# Patient Record
Sex: Female | Born: 1945 | Race: White | Hispanic: No | Marital: Single
Health system: Western US, Academic
[De-identification: ages and names within clinical notes are randomized; demographics above are authoritative.]

---

## 2013-12-28 MED ADMIN — predniSONE (DELTASONE) tablet 5 mg: 5 mg | ORAL | @ 16:00:00 | NDC 00054872425

## 2013-12-28 MED ADMIN — LORazepam (ATIVAN) tablet 0.25 mg: 0.25 mg | ORAL | @ 08:00:00 | NDC 51079041701

## 2013-12-28 MED ADMIN — LORazepam (ATIVAN) tablet 0.5 mg: 0.5 mg | ORAL | NDC 51079041701

## 2013-12-28 MED ADMIN — 0.9 % sodium chloride flush injection syringe: 3 mL | INTRAVENOUS | @ 09:00:00

## 2013-12-29 MED ADMIN — predniSONE (DELTASONE) tablet 5 mg: 5 mg | ORAL | @ 15:00:00 | NDC 00054472831

## 2013-12-29 MED ADMIN — meTOPROLOL tartrate (LOPRESSOR) tablet 50 mg: 50 mg | ORAL | @ 04:00:00 | NDC 00904634161

## 2013-12-29 MED ADMIN — meTOPROLOL tartrate (LOPRESSOR) tablet 100 mg: 100 mg | ORAL | @ 15:00:00 | NDC 00093073401

## 2013-12-29 MED ADMIN — LORazepam (ATIVAN) tablet 0.5 mg: 0.5 mg | ORAL | @ 22:00:00 | NDC 51079041701

## 2013-12-30 MED ADMIN — predniSONE (DELTASONE) tablet 5 mg: 5 mg | ORAL | @ 17:00:00 | NDC 00054472831

## 2013-12-30 MED ADMIN — meTOPROLOL tartrate (LOPRESSOR) tablet 100 mg: 100 mg | ORAL | @ 17:00:00 | NDC 00093073401

## 2013-12-30 MED ADMIN — 0.9 % sodium chloride flush injection syringe: 3 mL | INTRAVENOUS | @ 11:00:00

## 2013-12-30 MED ADMIN — LORazepam (ATIVAN) tablet 0.5 mg: 0.5 mg | ORAL | @ 17:00:00 | NDC 51079041701

## 2013-12-30 MED ADMIN — phytonadione (MEPHYTON) tablet 5 mg: 5 mg | ORAL | @ 02:00:00 | NDC 00187170405

## 2013-12-30 MED ADMIN — lansoprazole (PREVACID) capsule 30 mg: 30 mg | ORAL | @ 17:00:00 | NDC 00781214801

## 2013-12-30 MED ADMIN — phytonadione (MEPHYTON) tablet 5 mg: 5 mg | ORAL | @ 17:00:00 | NDC 00187170405

## 2013-12-31 MED ADMIN — LORazepam (ATIVAN) tablet 0.5 mg: 0.5 mg | ORAL | @ 19:00:00 | NDC 51079041701

## 2013-12-31 MED ADMIN — meTOPROLOL tartrate (LOPRESSOR) tablet 100 mg: 100 mg | ORAL | @ 15:00:00 | NDC 00093073401

## 2013-12-31 MED ADMIN — traZODone (DESYREL) tablet 50 mg: 50 mg | ORAL | @ 08:00:00 | NDC 00904399061

## 2013-12-31 MED ADMIN — LORazepam (ATIVAN) tablet 0.5 mg: 0.5 mg | ORAL | @ 01:00:00 | NDC 51079041701

## 2013-12-31 MED ADMIN — predniSONE (DELTASONE) tablet 5 mg: 5 mg | ORAL | @ 15:00:00 | NDC 00054472831

## 2013-12-31 MED ADMIN — enoxaparin (LOVENOX) injection syringe 40 mg: 40 mg | SUBCUTANEOUS | @ 21:00:00 | NDC 00075062040

## 2013-12-31 MED ADMIN — phytonadione (MEPHYTON) tablet 5 mg: 5 mg | ORAL | @ 15:00:00 | NDC 00187170405

## 2014-01-01 MED ADMIN — magnesium sulfate in dextrose 5 % 1 g/100 mL IVPB 1 g: 1 g | INTRAVENOUS | @ 19:00:00 | NDC 00409672723

## 2014-01-01 MED ADMIN — 0.9 % sodium chloride flush injection syringe: 3 mL | INTRAVENOUS | @ 12:00:00

## 2014-01-01 MED ADMIN — potassium chloride (KDUR; KLOR-CON) SR tablet 40 mEq: 40 meq | ORAL | @ 17:00:00 | NDC 68084052411

## 2014-01-01 MED ADMIN — LORazepam (ATIVAN) tablet 0.5 mg: 0.5 mg | ORAL | NDC 51079041701

## 2014-01-01 MED ADMIN — magnesium sulfate in dextrose 5 % 1 g/100 mL IVPB 1 g: 1 g | INTRAVENOUS | @ 17:00:00 | NDC 00409672723

## 2014-01-01 MED ADMIN — predniSONE (DELTASONE) tablet 5 mg: 5 mg | ORAL | @ 15:00:00 | NDC 00054472831

## 2014-01-01 MED ADMIN — LORazepam (ATIVAN) tablet 0.5 mg: 0.5 mg | ORAL | @ 19:00:00 | NDC 51079041701

## 2014-01-01 MED ADMIN — meTOPROLOL tartrate (LOPRESSOR) tablet 100 mg: 100 mg | ORAL | @ 15:00:00 | NDC 00093073401

## 2014-01-01 MED ADMIN — magnesium oxide (MAG-OX) tablet 500 mg: 500 mg | ORAL | @ 07:00:00

## 2014-01-02 MED ADMIN — 0.9 % sodium chloride flush injection syringe: 3 mL | INTRAVENOUS | @ 05:00:00

## 2014-01-02 MED ADMIN — 0.9 % sodium chloride flush injection syringe: 3 mL | INTRAVENOUS | @ 01:00:00

## 2014-01-02 MED ADMIN — predniSONE (DELTASONE) tablet 5 mg: 5 mg | ORAL | @ 17:00:00 | NDC 00054472831

## 2014-01-02 MED ADMIN — meTOPROLOL tartrate (LOPRESSOR) tablet 100 mg: 100 mg | ORAL | @ 17:00:00 | NDC 00093073401

## 2014-01-02 MED ADMIN — LORazepam (ATIVAN) tablet 0.5 mg: 0.5 mg | ORAL | @ 20:00:00 | NDC 51079041701

## 2014-01-02 MED ADMIN — 0.9 % sodium chloride flush injection syringe: 3 mL | INTRAVENOUS | @ 17:00:00

## 2014-01-02 MED ADMIN — enoxaparin (LOVENOX) injection syringe 40 mg: 40 mg | SUBCUTANEOUS | @ 17:00:00 | NDC 63323056887

## 2016-08-11 NOTE — Patient Instructions (Signed)
1. Hand therapy/ physical therapy   2. Discussed treatment options for Dupuytren's ,                3. F/u in 6 weeks               4. Wrist splints at night

## 2016-08-11 NOTE — Progress Notes (Signed)
This is an independent visit    Orthopaedic Hand and Upper Extremity Surgery New Patient Note     I had the pleasure of evaluating Aimee Smith in the [facility] today.     Chief Complaint:  Cramping and pain in both hands    History of Present Illness: Aimee Smith is a 71 y.o. year old RHD female who presents today for the first time to my clinic. Most recently has also noticed sharp spasm on right side of neck.    Duration of symptoms: one year    She rates the pain/cramping as a 10/10. The pain is located at the bilateral all fingers on the . The pain with cramping is described as sharp. Occurs daily.     She does not describe numbness or tingling, and does not describe weakness. She does not describe clicking, catching. She does experience intermittent triggering in Left MF.    The pain or numbness/tingling does not radiate. The patient does have symptoms at night. She does not have symptoms with resisted activity. Rest make the pain better. Motion does make the pain worse.  Sometimes when she moves her arms a certain way it will initiate the spasms.   She has tried gabapentin neuropathy in the feet. Also Keppra for seizures.    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 She also has no past medical history of Allergic state; Anemia; Anxiety; Asthma; Bleeding disorder; Blood transfusion without reported diagnosis; Cancer; CHF (congestive heart failure); Chronic kidney disease; COPD (chronic obstructive pulmonary disease); Coronary artery disease; Diabetes mellitus; Glaucoma; Heart disease; Human  immunodeficiency virus (HIV) disease; Intestinal disease; Melanoma; Myocardial infarction; Osteoporosis; Sinus disorder; Skin disease; Stroke; or Ulcer.  The remainder of the patient's history was reviewed and is noncontributory to this illness or injury.    PSH:  Aimee Smith  has a past surgical history that includes Hysterectomy and Appendectomy.    Medications:  Current Outpatient Prescriptions   Medication Sig Dispense Refill    gabapentin (NEURONTIN) 300 mg capsule TAKE 1 CAPSULE BY MOUTH 3 TIMES DAILY AND 2 CAPS AT BEDTIME (Patient taking differently: TAKES 2 CAPS AT BEDTIME) 180 capsule 11    levETIRAcetam (KEPPRA) 250 mg tablet Take 0.5 tablets (125 mg total) by mouth Twice a day. 30 tablet 11    melatonin 10 mg TAB Take 10 mg by mouth nightly at bedtime.      metoprolol tartrate (LOPRESSOR) 50 mg tablet 1 tablet in the morning, 1/2 tablet at night 135 tablet 3    mometasone (ELOCON) 0.1 % lotion Apply to scalp at night 60 mL 1    permethrin (ELIMITE) 5 % cream Apply from neck down today and then in 1 week 120 g 1    triamcinolone (KENALOG) 0.1 % ointment Apply twice daily as needed to trunk and legs 454 g 0     No current facility-administered medications for this visit.        Allergies:  Aimee Smith is allergic to morphine.    FH:  Her family history includes Basal cell carcinoma in her sister; Cancer in her father; Hypertension in her mother; Mitral valve prolapse in her mother; Osteoporosis in her mother; Skin ca.  unk/oth in her father.  The remainder of the patient's history was reviewed and is noncontributory to this illness or injury.    SH:  Aimee Smith  reports that she quit smoking about 33 years ago. Her smoking use included Cigarettes. She has a 5.00 pack-year smoking history. She has never used smokeless tobacco. She reports that she drinks alcohol. She reports that she does not use drugs.  The remainder of the patient's history was reviewed and is noncontributory to this illness or  injury.    Occupation/Recreational Activities:retired.  Enjoys golf  Review of Systems:   Constitutional - Negative  Eyes - Negative  Cardiovascular - Negative  Respiratory - Negative  GI - Negative  GU - Negative  Musculoskeletal - Upper extremity pain  Skin - Negative  Neurological - Negative  Psychiatric - Negative   Endocrine - Negative  Hematological / Lymphatic - Negative    Physical Exam:  BP Readings from Last 3 Encounters:   05/25/16 113/46   04/22/16 120/52   03/25/16 112/49     Wt Readings from Last 3 Encounters:   08/11/16 69.2 kg (152 lb 8 oz)   05/25/16 69.2 kg (152 lb 8 oz)   04/22/16 71.2 kg (156 lb 14.4 oz)     Estimated body mass index is 27.01 kg/m as calculated from the following:    Height as of this encounter: 160 cm (5\' 3" ).    Weight as of this encounter: 69.2 kg (152 lb 8 oz).  Estimated body surface area is 1.75 meters squared as calculated from the following:    Height as of this encounter: 160 cm (5\' 3" ).    Weight as of this encounter: 69.2 kg (152 lb 8 oz).    Appearance: Well developed, well nourished.  HEENT: Normocephalic, external ear normal bilaterally, nose normal, mucus membranes moist, PERRL, no scleral icterus, EOM normal, conjunctiva normal.  Neck: ROM normal and supple.  Cardiac: Intact distal pulses.  Pulmonary: Effort normal, no wheezing.  GI: No guarding or distension.  Neurological: A&O x3, normal coordination and tone.  Psych: Normal mood, affect and behavior. Normal thought content and judgment.  MSK: Gait normal    Right and left Upper Extremity:  Skin: intact  Swelling/Efusion: non  Deformity: dupuytren's in both hands cord in web on rt and index, palmer cord IF,, RRF & RMF  palmercords   ROM: full rom  Tenderness:   Tender LMF pulley  Circulation: <2s capillary refill in all digits  Sensation: Present to light touch in all digits,  Strength: 5 / 5   Phalens: negative, tinels: negative at wrist and elbows  Special Tests:      Imaging:  I personally reviewed and  interpreted the radiographs obtained.  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     Assessment:  1. Subjectively presents as possible cts and cubital tunnel, but objectively negative testing  2. Poly articular cramping  3. Dupuytren's contracture  4. Neck spasms    Plan:  The patient was educated extensively about their condition, including the pathology, etiology, natural history and treatment options. Non-operative measures as well as surgical interventions were both addressed. She voiced understanding, and all questions were answered.    I recommend initial treatment to include the following:    1. Hand therapy & physical therapy   2. Discussed treatment options for Dupuytren's ,  3. F/u in 6 weeks               4. Wrist splints at night    All of the patient's questions were answered and She agrees with this plan.

## 2016-08-26 NOTE — Patient Instructions (Addendum)
1) Please go by the office of your old gastroenterologist to get your colonoscopy records for me. I need to review them.  2) Do your home FIT kit to screen for colon cancer  3) Get labs done in April.  4) See me in May and we will follow up on the lab tests.  5) Schedule a visit at surgery clinic to discuss potential abdmonial surgery for pannus

## 2016-08-26 NOTE — Progress Notes (Signed)
Subjective    Aimee Smith is a 71 y.o. female who presents with concerns including No chief complaint on file.      History of present illness    Weight loss, intentional.  Thinks this is due to eating smaller portions/meals.   Overall good appetatie and energy    Hand pain and trigger finger  Rec is for hand pt, which plans to do.    Cirrhosis  Needs fu labs in April and ultrasound in October. Continues to abstain from etoh.    Tinges of pain at Upper L abdomen  Not happening currently    Varicose veins  Has more questions about these.    HCM  Forgot to get me her colo records from outside provider.      I reviewed the patient's pertinent allergies, medications, past medical history, social history and family history and made updates since the last visit date 05/25/2016 as appropriate.    ROS  All other systems were reviewed and are negative.    Objective      Vitals:    08/26/16 0953 08/26/16 0956   BP: 120/44 116/46   BP Location: Left upper arm Left upper arm   Patient Position: Sitting Sitting   Cuff Size: Adult Adult   Pulse: 53 55   SpO2: 100%    Weight: 68.7 kg (151 lb 8 oz)      Physical Exam   Constitutional: She is oriented to person, place, and time. She appears well-developed and well-nourished. No distress.   HENT:   Head: Normocephalic and atraumatic.   Eyes: Conjunctivae are normal. Pupils are equal, round, and reactive to light. No scleral icterus.   Pulmonary/Chest: Effort normal.   Musculoskeletal: She exhibits no edema.   Does not have compression stocking in place.   Neurological: She is alert and oriented to person, place, and time.   Skin: She is not diaphoretic.   Psychiatric: She has a normal mood and affect. Her behavior is normal. Thought content normal.           I personally reviewed and interpreted the following test(s):      Assessment and Plan        Problem based assessment and plan:      1. Health care maintenance  Given that we still don't have outside records, pt and I  discussed screening options. She is willing to do FIT test.  - Fecal Occult Blood Test (FIT); Future    3. Abdominal pannus  Now that has lost weight, has abdominal skin folds that may need pannuloplasty.  - Ambulatory referral to General Surgery; Future    4. Alcoholic cirrhosis of liver with ascites  Stable.  -due for labs and imaging later this year    5. Trigger finger, unspecified finger, unspecified laterality  Agree with hand PT plan  -sdvised pt to schedule    6. Varicose veins of both lower extremities  Vascular is not planning to offer rx and pt is not interestedin using compression stockings  -answered questions    Weight loss, intentional  Doing well with portion size control  -commended pt on slow steady weight loss                Medication List          Accurate as of 08/26/16 10:30 AM. If you have any questions, ask your nurse or doctor.  CHANGE how you take these medications    gabapentin 300 mg capsule  Commonly known as:  NEURONTIN  TAKE 1 CAPSULE BY MOUTH 3 TIMES DAILY AND 2 CAPS AT BEDTIME  What changed:  See the new instructions.        CONTINUE taking these medications    levETIRAcetam 250 mg tablet  Commonly known as:  KEPPRA  Take 0.5 tablets (125 mg total) by mouth Twice a day.     melatonin 10 mg Tab     metoprolol tartrate 50 mg tablet  Commonly known as:  LOPRESSOR  1 tablet in the morning, 1/2 tablet at night     mometasone 0.1 % lotion  Commonly known as:  ELOCON  Apply to scalp at night     permethrin 5 % cream  Commonly known as:  ELIMITE  Apply from neck down today and then in 1 week     triamcinolone 0.1 % ointment  Commonly known as:  KENALOG  Apply twice daily as needed to trunk and legs

## 2016-09-09 NOTE — Progress Notes (Signed)
Patient no-showed today's appointment.

## 2017-03-10 NOTE — Telephone Encounter (Signed)
Refill request from pharmacy   Medication name: gabapentin  Last prescribed: 08/28/16   Last appointment: 08/26/2016  Next appointment: Visit date not found    [facility]    Last  BP:  BP Readings from Last 1 Encounters:   08/26/16 116/46         Last Labs:      Lab Results   Component Value Date    Creatinine 0.48 05/27/2016    Sodium, Serum / Plasma 138 05/27/2016    Potassium, Serum / Plasma 4.0 05/27/2016    Hemoglobin A1c 5.6 07/11/2015    Cholesterol, Total 196 07/11/2015    LDL Cholesterol 132 (H) 07/11/2015    Cholesterol, HDL 48 07/11/2015    Thyroid Stimulating Hormone 2.77 02/19/2016    Hemoglobin 13.9 02/19/2016    eGFR if non-African American 99 05/27/2016    eGFR if African Amer 114 05/27/2016       If patient has not been seen in clinic for 12 months or more I have routed encounter to the administrative team to book a follow-up appointment.

## 2017-05-31 NOTE — Telephone Encounter (Signed)
Copied from [place] [id number]. Topic: [facility] - Immunization Request  >> May 31, 2017  4:19 PM Leana Gamer wrote:  IMMUNIZATION TEMPLATE    PATIENT NAME: [name]  DATE OF BIRTH: 1946-03-17  MRN: [id number]    HOME PHONE NUMBER:    [phone]    ALTERNATE PHONE NUMBER:       LEAVING A MESSAGE OK?:    Yes    IMMUNIZATION REQUESTED:  Shingles   REASON FOR IMMUNIZATION:    HAVE YOU HAD THIS IMMUNIZATION BEFORE: Yes  IF PPD, WAS PPD SKIN TEST POSITIVE:  IF POSITIVE, WAS THERE A CHEST X RAY PERFORMED:     MESSAGE / PROBLEM:  Pt called stating she had shingles vaccine last year. Pt stated that she heard there is a new one and would like to know if she should get that done as well. Pt is aware of the turn around time.    MESSAGE GENERATED BY [facility]  CRM NUMBER [phone] CREATED BY:    Leana Gamer,     05/31/2017,      4:19 PM

## 2017-06-01 MED ORDER — VARICELLA-ZOSTER GLYCOE VACC-AS01B ADJ(PF) 50 MCG/0.5 ML IM SUSP, KIT
50 | Freq: Once | INTRAMUSCULAR | 1 refills | Status: AC
Start: 2017-06-01 — End: 2017-06-01

## 2017-06-01 NOTE — Telephone Encounter (Signed)
Spoke to pt.  Pt is aware of message below.  No further questions

## 2017-06-01 NOTE — Telephone Encounter (Signed)
Clearnce Hasten, NP  You; [facility] 2 15 minutes ago (10:56 AM)      Order signed, and sent to pharmacy, thanks   Sonia Baller  (Routing comment)

## 2017-07-28 MED ORDER — METOPROLOL TARTRATE 50 MG TABLET: 50 mg | ORAL | 0 refills | Status: DC

## 2017-07-28 NOTE — Telephone Encounter (Signed)
Medication Metoprolol 50 mg  Last prescribed 07/29/16  Last appointment: 08/26/16  Next appointment: Visit date not found      Pharmacy [facility]ing Refill[facility]   [facility] [address]    Last  BP:  BP Readings from Last 1 Encounters:   08/26/16 116/46         Last Labs:      Lab Results   Component Value Date    Creatinine 0.48 05/27/2016    Sodium, Serum / Plasma 138 05/27/2016    Potassium, Serum / Plasma 4.0 05/27/2016    Hemoglobin A1c 5.6 07/11/2015    Cholesterol, Total 196 07/11/2015    LDL Cholesterol 132 (H) 07/11/2015    Cholesterol, HDL 48 07/11/2015    Thyroid Stimulating Hormone 2.77 02/19/2016    Hemoglobin 13.9 02/19/2016    eGFR if non-African American 99 05/27/2016    eGFR if African Amer 114 05/27/2016       If patient has not been seen in clinic for 12 months or more I have routed encounter to the administrative team to book a follow-up appointment.

## 2017-08-15 MED ORDER — LEVETIRACETAM 250 MG TABLET
250 | ORAL | 11 refills | Status: DC
Start: 2017-08-15 — End: 2018-06-01

## 2017-10-26 MED ORDER — METOPROLOL TARTRATE 50 MG TABLET
50 | ORAL | 0 refills | Status: DC
Start: 2017-10-26 — End: 2018-01-26

## 2018-01-26 MED ORDER — METOPROLOL TARTRATE 50 MG TABLET
50 | ORAL_TABLET | ORAL | 0 refills | Status: DC
Start: 2018-01-26 — End: 2018-04-26

## 2018-01-26 NOTE — Telephone Encounter (Signed)
Medication Metoprolol 81m  Last prescribed 10/26/2017  Last appointment: 08/26/2016  Next appointment: Visit date not found      [facility] [address]      Last  BP:  BP Readings from Last 1 Encounters:   08/26/16 116/46         Last Labs:      Lab Results   Component Value Date    Creatinine 0.48 05/27/2016    Sodium, Serum / Plasma 138 05/27/2016    Potassium, Serum / Plasma 4.0 05/27/2016    Hemoglobin A1c 5.6 07/11/2015    Cholesterol, Total 196 07/11/2015    LDL Cholesterol 132 (H) 07/11/2015    Cholesterol, HDL 48 07/11/2015    Thyroid Stimulating Hormone 2.77 02/19/2016    Hemoglobin 13.9 02/19/2016    eGFR if non-African American 99 05/27/2016    eGFR if African Amer 114 05/27/2016       If patient has not been seen in clinic for 12 months or more I have routed encounter to the administrative team to book a follow-up appointment.

## 2018-01-26 NOTE — Telephone Encounter (Signed)
Will refill 90 day supply pending appointment.

## 2018-01-26 NOTE — Telephone Encounter (Signed)
I called & spoke to pt this afternoon, she is aware of Dr. Larry Sierras is still on medical leave, she will call us back to schedule appt with Dr. Estrella Deeds.

## 2018-03-13 ENCOUNTER — Ambulatory Visit: Admit: 2018-03-13 | Discharge: 2018-03-13 | Payer: MEDICARE

## 2018-03-13 DIAGNOSIS — G40909 Epilepsy, unspecified, not intractable, without status epilepticus: Secondary | ICD-10-CM

## 2018-03-13 DIAGNOSIS — J849 Interstitial pulmonary disease, unspecified: Secondary | ICD-10-CM

## 2018-03-13 DIAGNOSIS — Z0000 Encounter for general adult medical examination without abnormal findings: Secondary | ICD-10-CM

## 2018-03-13 DIAGNOSIS — Z1231 Encounter for screening mammogram for malignant neoplasm of breast: Secondary | ICD-10-CM

## 2018-03-13 DIAGNOSIS — R600 Localized edema: Secondary | ICD-10-CM

## 2018-03-13 DIAGNOSIS — K7030 Alcoholic cirrhosis of liver without ascites: Secondary | ICD-10-CM

## 2018-03-13 DIAGNOSIS — K7031 Alcoholic cirrhosis of liver with ascites: Secondary | ICD-10-CM

## 2018-03-13 DIAGNOSIS — G4700 Insomnia, unspecified: Secondary | ICD-10-CM

## 2018-03-13 DIAGNOSIS — I8390 Asymptomatic varicose veins of unspecified lower extremity: Secondary | ICD-10-CM

## 2018-03-13 DIAGNOSIS — F1021 Alcohol dependence, in remission: Secondary | ICD-10-CM

## 2018-03-13 DIAGNOSIS — I471 Supraventricular tachycardia: Secondary | ICD-10-CM

## 2018-03-13 DIAGNOSIS — D696 Thrombocytopenia, unspecified: Secondary | ICD-10-CM

## 2018-03-13 DIAGNOSIS — L989 Disorder of the skin and subcutaneous tissue, unspecified: Secondary | ICD-10-CM

## 2018-03-13 DIAGNOSIS — E65 Localized adiposity: Secondary | ICD-10-CM

## 2018-03-13 LAB — COMPLETE BLOOD COUNT
Hematocrit: 41.2 % (ref 36–46)
Hemoglobin: 13.7 g/dL (ref 12.0–15.5)
MCH: 30.4 pg (ref 26–34)
MCHC: 33.3 g/dL (ref 31–36)
MCV: 92 fL (ref 80–100)
Platelet Count: 93 10*9/L — ABNORMAL LOW (ref 140–450)
RBC Count: 4.5 10*12/L (ref 4.0–5.2)
WBC Count: 3.4 10*9/L (ref 3.4–10)

## 2018-03-13 LAB — COMPREHENSIVE METABOLIC PANEL
AST: 36 U/L (ref 17–42)
Alanine transaminase: 21 U/L (ref 11–50)
Albumin, Serum / Plasma: 4 g/dL (ref 3.5–4.8)
Alkaline Phosphatase: 58 U/L (ref 31–95)
Anion Gap: 7 (ref 4–14)
Bilirubin, Total: 0.6 mg/dL (ref 0.2–1.3)
Calcium, total, Serum / Plasma: 9.4 mg/dL (ref 8.8–10.3)
Carbon Dioxide, Total: 27 mmol/L (ref 22–32)
Chloride, Serum / Plasma: 105 mmol/L (ref 97–108)
Creatinine: 0.67 mg/dL (ref 0.44–1.00)
Glucose, non-fasting: 103 mg/dL (ref 70–199)
Potassium, Serum / Plasma: 4.3 mmol/L (ref 3.5–5.1)
Protein, Total, Serum / Plasma: 7.5 g/dL (ref 6.0–8.4)
Sodium, Serum / Plasma: 139 mmol/L (ref 135–145)
Urea Nitrogen, Serum / Plasma: 9 mg/dL (ref 6–22)
eGFR - high estimate: 102 mL/min
eGFR - low estimate: 88 mL/min

## 2018-03-13 LAB — ALPHA-FETOPROTEIN, SERUM: Alpha-Fetoprotein, serum: 4.8 ug/L (ref <8.9)

## 2018-03-13 NOTE — Assessment & Plan Note (Signed)
Remains abstinent  - Continue night-time gabapentin, no medication changes today.

## 2018-03-13 NOTE — Patient Instructions (Addendum)
It was a pleasure seeing you in clinic today. Here are some of the things we discussed for you to do afterwards.  - FIT test (a test for blood in your stool)   - Please see the neurologist - ask if you can discontinue the [place]  - Please get your mammogram   - Please get your labs done.  - Please get your abdominal ultrasound done to check on your liver  - Please follow up with General Surgery for a possible pannuloplasty.

## 2018-03-13 NOTE — Progress Notes (Signed)
Chief Complaint   Patient presents with    Foot Pain       Subjective / HPI       Aimee Smith is a 72 y.o. 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      Last seen by PCP: 08/26/2016  Recent events: last seen in 2018, at which time she was continuing to lose weight, considered referral to GenSurg for possible pannuloplasty. Not seen since then, no labs.     Today: Reports that her health is generally improved, but she is worried about her weight  - is 158 today, was max 170 on prednisone, normal is 130. She is also worried about pain in her left foot that she thinks was caused by doubling it under while driving. Also wants follow up with neuro and pulm for her epilepsy and ILD respectively. Finally, she has some concerns about her balance and noted that she felt 'off balance' during a golf tournament 1 month ago     #Epilepsy.  Seen by Epilepsy in 2017, at which time they mentioned trying to discontinue her keppra at that time. They had had her off it for 6 weeks but she reported a feeling of something coming on, sothey put her on a lower dose. She has not had any seizure episodes since then. Unclear when last seizure was.    #Cirrhosis  No yellowing of her skin, occasional itching in feet that she attributes to her leg veins. No ascites. She notes that she has not had abdominal distension.     #ILD  No cough, no shortness of breath. Sometimes coughs after eatin but thinks that is probably unrelated. Notes that a lot of her weight gain is from the prednisone that she was on.     #Moderate alcohol use disorder, in sustained remission  Continues to take gabapentin at night only (it makes her sleepy). Has remained abstinent.     #Pannus  Requesting surgery consult for possible  pannuloplasty    #Varicose veins  Has varicose veins that bother her, wondering about any vascular surgery procedures she may need.    #Foot pain   Pain on left side of foot of unclear etiology. Askign if she needs referral to foot specialist. Pain is on the dorsum of her foot, not the bottom. No associated weakness or numbness.     #Balance issues  Sometimes feels 'unbalanced' especially when walking for long periods (eg golf tournament). Able to walk normally today.    Meds:     Current Outpatient Medications on File Prior to Visit   Medication Sig Dispense Refill    gabapentin (NEURONTIN) 300 mg capsule TAKE 1 CAPSULE BY MOUTH 3 TIMES DAILY AND 2 CAPS AT BEDTIME 180 capsule 11    levETIRAcetam (KEPPRA) 250 mg tablet TAKE 0.5 TABLET (HALF) BY MOUTH TWICE A DAY. 30 tablet 11    melatonin 10 mg TAB Take 10 mg by mouth nightly at bedtime.      metoprolol tartrate (LOPRESSOR) 50 mg tablet 1 TABLET IN THE MORNING, 1/2 TABLET AT NIGHT 135 tablet 0    mometasone (ELOCON) 0.1 % lotion Apply to scalp at night 60 mL 1    permethrin (ELIMITE) 5 % cream Apply from neck down today and then in 1 week 120 g 1  triamcinolone (KENALOG) 0.1 % ointment Apply twice daily as needed to trunk and legs 454 g 0     No current facility-administered medications on file prior to visit.            ROS:   Review of Systems   Constitutional: Positive for malaise/fatigue. Negative for chills, fever and weight loss.   Respiratory: Negative for cough, shortness of breath and wheezing.    Cardiovascular: Negative for chest pain and leg swelling.   Gastrointestinal: Negative for abdominal pain.        No distension  Pannus which she finds uncomfortable    Musculoskeletal: Positive for joint pain.   Skin: Positive for itching. Negative for rash.        Itching in b/l extremities that she attributse to varicose veins    Neurological: Negative for dizziness and seizures.             Exam / Objective Data    BP 139/57 (BP Location: Left upper  arm, Patient Position: Sitting, Cuff Size: Adult)   Pulse 54   Wt 72 kg (158 lb 11.2 oz)   BMI 28.11 kg/m       Physical Exam   Constitutional: She is oriented to person, place, and time. She appears well-developed and well-nourished. No distress.   HENT:   Head: Normocephalic and atraumatic.   Eyes: Pupils are equal, round, and reactive to light. Conjunctivae and EOM are normal.   Cardiovascular: Normal rate, regular rhythm and normal heart sounds. Exam reveals no gallop and no friction rub.   No murmur heard.  Pulmonary/Chest: Effort normal and breath sounds normal. No respiratory distress. She has no wheezes. She has no rales.   Breathing comfortably at rest and with ambulation    Abdominal: Soft. Bowel sounds are normal. She exhibits no distension. There is no tenderness. There is no rebound.   Liver edge not palpable  No ascites  Pannus    Musculoskeletal: Normal range of motion. She exhibits no edema.   Mild ttp in dorsum of left foot    Neurological: She is alert and oriented to person, place, and time.   Skin: Skin is warm and dry. She is not diaphoretic.   Marked varicose veins b/l in her LE    Psychiatric: She has a normal mood and affect. Her behavior is normal.         Assessment and Plan:     Aimee Smith is a 72 y.o. 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    #Epilepsy.  Unclear history of seizures, previously seen by Neuro who decreased her Keppra. Wonder if she might be able to come off it (especially if seizures were related to acute pancreatitis, as the chart appears to suggest). Previous neuro notes (eg 2016) mention that they think she may not need long-term anticonvulsants.   - Referred to Neurology to discuss discontinuing Keppra     #Cirrhosis  Secondary to alcohol use,  now in remission  EtOH: abstinent 12/2013, remains abstinent   [place] screens: neg [place] 03/2014, 09/2014; neg AFP 04/2014 and 07/2014; due, ordered today   Abdominal ultrasound: due, ordered today   EGD: done OSH 05/2013 no varices, r  Hep A: 03/20/2014, 09/25/2014  Hep B: 03/20/2014, 04/23/2014, 09/25/2014  - comprehensive metabolic panel  and CBC ordered today     #ILD  ILD was a diagnosis without clear cause. She was seen and diagnosed in 2013 with SOB and cough. A clear cause was not known and it was thought that this might be related to aspiration; and that she might possibly have achalasia. She udnerwent dilation (per Pulm note) which was useful.Currently she is asymptomatic with no shortness of breath or cough and normal pulmonary exam.  - CTM, no indication for Pulm referral today   - consider outpatient referral to S&S if she continues having difficulty with coughing    #Moderate alcohol use disorder, in sustained remission  Remains abstinent  - Continue night-time gabapentin, no medication changes today.     #Pannus  Requesting surgery consult for possible pannuloplasty.  - Referred to Gen Surg today.     #Varicose veins  Has varicose veins that bother her, wondering about any vascular surgery procedures she may need.  - Deferred Vascular referral today, will re-address as higher priority items addressed.    #Foot pain   Left foot pain, dorsal, unclear etiology.  -A1c with labs  - Further workup deferred to next visit given time constraints.     #Balance issues  Not experiencing symptoms today. Will address at next visit given time constraints.     #Health Maintenance  Cancer Screening  -Colonoscopy every 10 years or FIT yearly: due, ordered FIT test today   -Pap every 3 years (or every 5 years if co-tested with HPV and negative once >30): due, address at next visit   -low-dose CT if age between 75-74 and 71 pack year history: had in 2015  -Breast: mammography every 2 years starting age 78 up to age 66: last in 2017,  due, ordered     General  -DEXA age >57 women: uptodate from 32,normal     Cardiovascular  -AAA: [place] if age 32-75 female and current or former smoker: NA     Vaccines  -annual flu vaccine: indicated, not yet avaialble   -Tdap 1x with Td booster every 10 years: had 2011, due 2021  -Zoster for patients > 50 : needs shingrix, address at next vist   -Hep B vaccine if < 60, or >60 if diabetic   -Pneumococcus:  Up to date, had 2016    -Age>65, then PCV13+PPSV23   -Age< 64 with DM, current smoker, or chronic heart, lung, or liver disease, then PPSV23 only    -AGE < 65 with immunocompromise, PCV13+PPSV23        RTC 2 months - f/u labs, discuss Vascular, discuss imaging     [name], M.D, Ph.D  PGY2, Internal Medicine

## 2018-03-13 NOTE — Assessment & Plan Note (Signed)
ILD was a diagnosis without clear cause. She was seen and diagnosed in 2013 with SOB and cough. A clear cause was not known and it was thought that this might be related to aspiration; and that she might possibly have achalasia. She udnerwent dilation (per Pulm note) which was useful.Currently she is asymptomatic with no shortness of breath or cough and normal pulmonary exam.  - CTM, no indication for Pulm referral today   - consider outpatient referral to S&S if she continues having difficulty with coughing

## 2018-03-13 NOTE — Progress Notes (Signed)
03/15/18    ATTESTATION:    My date of service is 03/13/2018. I was present for and performed key portions of an examination of the patient. I am personally involved in the management of the patient. I agree with the findings and care plans as documented. 71W cirrhosis and thrombocytopenia out of care x1 year presenting for regular follow up. Will obtain baseline screening labs and ultrasound at today's visit; patient to return to clinic in a few weeks to follow up multiple issues.

## 2018-03-13 NOTE — Assessment & Plan Note (Signed)
Secondary to alcohol use, now in remission  EtOH: abstinent 12/2013, remains abstinent   [place] screens: neg [place] 03/2014, 09/2014; neg AFP 04/2014 and 07/2014; due, ordered today   Abdominal ultrasound: due, ordered today   EGD: done OSH 05/2013 no varices, r  Hep A: 03/20/2014, 09/25/2014  Hep B: 03/20/2014, 04/23/2014, 09/25/2014  - comprehensive metabolic panel and CBC ordered today

## 2018-03-14 LAB — HEMOGLOBIN A1C: Hemoglobin A1c: 5.7 PERCENT — ABNORMAL HIGH (ref 4.3–5.6)

## 2018-03-16 NOTE — Telephone Encounter (Signed)
Copied from [place] [id number]. Topic: EPIL - Advice  >> Mar 15, 2018  3:53 PM Theola Sequin wrote:  ADVICE TEMPLATE    PATIENT NAME: Aimee Smith  DATE OF BIRTH: October 13, 1945  MRN: [id number]    HOME PHONE NUMBER:     [phone]    ALTERNATE PHONE NUMBER:    ----  LEAVING A MESSAGE OK?:   Yes    SYMPTOMS:   dizziness, off balance with walking (main thing) walking like she's drunk which she doesn't drink     DURATION OF SYMPTOMS:   a few months but it's gotten worse   TAKING MEDICATION FOR SYMPTOMS:     Yes  IF YES, WHAT MEDICATION      kepra, medication for heart rate, gabapentin, sleep aid (over the counter) in the vitamin section     EXPERIENCING PAIN:     yes - different places (discussed with primary care physician)   PAIN LEVEL (SCALE 1 - 10):   saw PCP last [place] INFORMATION  PAYOR: Medicare   PLAN: Part A&b [place] / PROBLEM:     patient called stating that her PCP asked her to follow up with Dr. Bobbye Charleston because of the dizziness and the off balance    Patient scheduled first available appt on 10/29 but requested advice and/or a sooner f/up visit, please assist       [place]  [place] [phone] CREATED BY:    Theola Sequin,     03/15/2018,      3:53 PM

## 2018-03-17 ENCOUNTER — Encounter: Admit: 2018-03-17 | Discharge: 2018-03-18 | Payer: MEDICARE

## 2018-03-17 DIAGNOSIS — Z0000 Encounter for general adult medical examination without abnormal findings: Secondary | ICD-10-CM

## 2018-03-17 NOTE — Telephone Encounter (Signed)
I received a call back from Ms. Aimee Smith. She reports that over the last few months she has felt off balance, stumbling gait, unsteady. She states it is different than feeling dizzy, which would be in her head, this is a body balance issue. No falls or injuries. She denies seizures or new medications.     Currently taking:   -gabapentin 600 mg QHS   -LEV 125 mg BID   -melatonin 10 mg QHS   -metoprolol 50/25    She saw her PCP last week which was unrevealing. PCP suggested she see neurology.     I suggested she schedule visit with Dr. Bobbye Charleston as she was last seen over 2 years ago. F/up visit scheduled 9/23 @ 4:30 PM.

## 2018-03-20 LAB — FECAL OCCULT BLOOD TEST: Fecal Occult Blood Test: NEGATIVE

## 2018-03-21 ENCOUNTER — Ambulatory Visit: Admit: 2018-03-21 | Discharge: 2018-03-21 | Payer: MEDICARE

## 2018-03-21 DIAGNOSIS — L987 Excessive and redundant skin and subcutaneous tissue: Secondary | ICD-10-CM

## 2018-03-21 NOTE — H&P (Signed)
Thank you for referring Aimee Smith to the Plastic Surgery service at [facility] for evaluation of a redundant soft tissue on her trunk and abdomen.    HISTORY OF PRESENT ILLNESS:    The patient is a 72 year old woman who presents stating that she is 5 feet 3 inches tall, weighs 155 pounds, and wishes to discus the excess fatty tissue and excess soft tissue on her trunk and particularly on her anterior abdomen. She stated that this occurred after she was on prednisone for interstitial lung disease. She gained about 40 pounds at that time and although she has since lost 10 pounds, the excess soft tissue and tissue laxity has persisted.    The patient's past medical history is significant for diagnoses of interstitial lung disease, cirrhosis, seizure disorder, and a past history of pancreatitis.    Patient Active Problem List   Diagnosis    Cirrhosis    ILD (interstitial lung disease) ([place])    Arrhythmia    Moderate alcohol use disorder, in sustained remission ([place])    Seizure ([place])    Skin lesion of right lower extremity    Health care maintenance    Sciatica of right side    Low back pain    Venous insufficiency of both lower extremities    Knee pain, bilateral    Pre-op exam    Osteoarthritis of right hip    Insomnia    Osteopenia    Elevated TSH    Urinary incontinence    Varicose veins of both lower extremities    Trigger finger    Thrombocytopenia (HCC)    AVNRT (AV nodal re-entry tachycardia) ([place])       Past Medical History:   Diagnosis Date    Achalasia     Arrhythmia     Depression     GERD (gastroesophageal reflux disease)     Headache     migraine with visual aura    Hypothyroidism 07/11/2015    ILD (interstitial lung disease) ([place])     Liver disease     Meniere's disease     last vertigo attack was 2014    Moderate alcohol use disorder, in early remission ([place]) 01/15/2014    Nonmelanoma skin cancer     Osteoarthritis of right hip     Pancreatitis     Sciatica of right side  03/20/2014    Seizures (HCC)     Thrombocytopenia (HCC)        Past Surgical History:   Procedure Laterality Date    APPENDECTOMY      HYSTERECTOMY          Allergies/Contraindications   Allergen Reactions    Morphine      "swollen itchy face"       Current Outpatient Medications   Medication Sig Dispense Refill    gabapentin (NEURONTIN) 300 mg capsule TAKE 1 CAPSULE BY MOUTH 3 TIMES DAILY AND 2 CAPS AT BEDTIME (Patient taking differently: nightly at bedtime. ) 180 capsule 11    levETIRAcetam (KEPPRA) 250 mg tablet TAKE 0.5 TABLET (HALF) BY MOUTH TWICE A DAY. 30 tablet 11    melatonin 10 mg TAB Take 10 mg by mouth nightly at bedtime.      metoprolol tartrate (LOPRESSOR) 50 mg tablet 1 TABLET IN THE MORNING, 1/2 TABLET AT NIGHT 135 tablet 0    mometasone (ELOCON) 0.1 % lotion Apply to scalp at night (Patient not taking: Reported on 03/21/2018) 60 mL 1    permethrin (ELIMITE)  5 % cream Apply from neck down today and then in 1 week (Patient not taking: Reported on 03/21/2018) 120 g 1    triamcinolone (KENALOG) 0.1 % ointment Apply twice daily as needed to trunk and legs (Patient not taking: Reported on 03/21/2018) 454 g 0     No current facility-administered medications for this visit.        There are no discontinued medications.      Social History     Socioeconomic History    Marital status: Single     Spouse name: Not on file    Number of children: Not on file    Years of education: 13    Highest education level: Not on file   Occupational History     Employer: RETIRED     Comment: Stewardess   Social Transport planner strain: Not on file    Food insecurity:     Worry: Not on file     Inability: Not on file    Transportation needs:     Medical: Not on file     Non-medical: Not on file   Tobacco Use    Smoking status: Former Smoker     Packs/day: 0.50     Years: 10.00     Pack years: 5.00     Types: Cigarettes     Last attempt to quit: 08/10/1983     Years since quitting: 34.6    Smokeless  tobacco: Never Used   Substance and Sexual Activity    Alcohol use: Yes     Comment: Drank 3-4 glasses of wine per day until May 2015.    Drug use: No    Sexual activity: Not Currently     Partners: Male   Lifestyle    Physical activity:     Days per week: Not on file     Minutes per session: Not on file    Stress: Not on file   Relationships    Social connections:     Talks on phone: Not on file     Gets together: Not on file     Attends religious service: Not on file     Active member of club or organization: Not on file     Attends meetings of clubs or organizations: Not on file     Relationship status: Not on file    Intimate partner violence:     Fear of current or ex partner: Not on file     Emotionally abused: Not on file     Physically abused: Not on file     Forced sexual activity: Not on file   Other Topics Concern    Not on file   Social History Narrative    Born in [place]. Widowed. Three stepchildren. Close to one of the stepchildren. Lives alone with cat. Retired Actor. Enjoys golf and being with friends.    Help from [place] in [facility]        Family History   Problem Relation Name Age of Onset    Hypertension Mother Kennyth Arnold     Osteoporosis Mother [facility]     Mitral valve prolapse Mother Kennyth Arnold     Cancer Father Jenny Reichmann     Skin ca. unk/oth Father John     Basal cell carcinoma Sister      Squamous cell carcinoma Neg Hx         ROS    Physical Exam  On examination, this is a small woman who is overweight with a body mass index of 27. There is adipose deposition above and below the umbilicus and in the flank areas, and there is skin relaxation producing prominence or protrusion in these areas but there is no significant pannus formation. There are old well-healed scars in the midline below the umbilicus from an appendectomy, transversely in the suprapubic area from a hysterectomy, and periumbilically from a gynecologic laparoscopy. The scars are bound down and consequently the soft  tissue prominence around them is more noticeable. Abdominal wall muscle tone is fair.     Assessment and Plan:    Using photographs and diagrams, I told the patient about abdominoplasty. I explained that this is an operation done in persons at or near their normal weight who have minimal adipose deposition.  The operation is designed to remove excess or redundant skin and is not successful in individuals who have prominent fat deposits since the overall contour is not improved by removing only a strip of skin on the lower abdomen. The patient asked about liposuction, and I told her that this is not helpful when there is skin laxity since it does not tighten the loose skin but merely worsens the situation with regard to overhang and draping.    We talked about her weight which is elevated and she indicated that she would like to be in the range of about 135-140 pounds which was the case before she was on the prednisone. She talked about working with a Physiological scientist to reduce the excess weight and we also discussed exercises to strengthen her core muscles. I explained that this would help to contour the abdominal wall such that she might not feel surgery was necessary, or perhaps eliminating the adipose layer would make it possible to consider an abdominoplasty that could be done successfully at some point in the future.    We also reviewed the fact that abdominoplasty is cosmetic surgery and is not covered by medical health insurance.    The patient stated that she appreciated this discussion. She indicates that she may return to see me after she has made some progress with her weight reduction and toning.

## 2018-03-22 MED ORDER — GABAPENTIN 300 MG CAPSULE: 300 mg | ORAL | 11 refills | Status: DC

## 2018-03-22 NOTE — Telephone Encounter (Signed)
I have refilled your prescription for gabapentin.

## 2018-03-22 NOTE — Telephone Encounter (Signed)
Current pended order:  gabapentin (NEURONTIN) 300 mg capsule [Pharmacy Med Name: GABAPENTIN 300 MG CAPSULE]  TAKE 1 CAPSULE BY MOUTH 3 TIMES DAILY AND 2 CAPS AT BEDTIME    Last time approved:  gabapentin (NEURONTIN) 300 mg capsule  TAKE 1 CAPSULE BY MOUTH 3 TIMES DAILY AND 2 CAPS AT BEDTIME  Dispense: 180 capsule Refill: 11  [facility] MZ 5361 1 by Susa Griffins ANN on 03/10/17        Last appointment: No previous visit found  Next appointment:  03/28/2018        Last  BP:  BP Readings from Last 1 Encounters:   03/21/18 129/63         Last Labs:      Lab Results   Component Value Date    Creatinine 0.67 03/13/2018    Sodium, Serum / Plasma 139 03/13/2018    Potassium, Serum / Plasma 4.3 03/13/2018    Hemoglobin A1c 5.7 (H) 03/13/2018    Cholesterol, Total 196 07/11/2015    LDL Cholesterol 132 (H) 07/11/2015    Cholesterol, HDL 48 07/11/2015    Thyroid Stimulating Hormone 2.77 02/19/2016    Hemoglobin 13.7 03/13/2018    eGFR if non-African American 88 03/13/2018    eGFR if African Amer 102 03/13/2018       If patient has not been seen in clinic for 12 months or more I have routed encounter to the administrative team to book a follow-up appointment.

## 2018-03-28 ENCOUNTER — Ambulatory Visit: Admit: 2018-03-28 | Discharge: 2018-03-28 | Payer: MEDICARE

## 2018-03-28 DIAGNOSIS — A46 Erysipelas: Secondary | ICD-10-CM

## 2018-03-28 DIAGNOSIS — R269 Unspecified abnormalities of gait and mobility: Secondary | ICD-10-CM

## 2018-03-28 DIAGNOSIS — E65 Localized adiposity: Secondary | ICD-10-CM

## 2018-03-28 DIAGNOSIS — I8392 Asymptomatic varicose veins of left lower extremity: Secondary | ICD-10-CM

## 2018-03-28 DIAGNOSIS — M2550 Pain in unspecified joint: Secondary | ICD-10-CM

## 2018-03-28 DIAGNOSIS — R600 Localized edema: Secondary | ICD-10-CM

## 2018-03-28 NOTE — Progress Notes (Signed)
03/29/18    ATTESTATION:    My date of service is 03/28/2018. I was present for and performed key portions of an examination of the patient.   I am personally involved in the management of the patient. I agree with the findings and care plans as documented.  Alma Downs, MD

## 2018-03-28 NOTE — Progress Notes (Signed)
Chief Complaint   Patient presents with    Joint Pain      Ramana    Subjective / HPI       Aimee Smith is a 72 y.o. woman who presents for follow up.     Interval history:  - scheduled for Neurology in September  - seen by Gen Surg about pannuloplasty, holding off for now  - labs notable for normal CMP, CBC w/ platelets of 93, nl AFP. FIT test negative. A1c 5.7.   - has not had mammogram  - has not had abdominal US    #Patch on nose  Reports a scaly patch on her nose that sometimes looks red. Had a scraping test once. Wants Derm referral.     #Pain  Having pain in multiple joints, seen by hand and foot specailist before. Wants physical therapy .     Otherwise reports very gassy. Also concerned about leg swelling, worst at end of day. No shortness of breath. No inability to lie flat.     Meds:     Current Outpatient Medications on File Prior to Visit   Medication Sig Dispense Refill    gabapentin (NEURONTIN) 300 mg capsule TAKE 1 CAPSULE BY MOUTH 3 TIMES DAILY AND 2 CAPS AT BEDTIME 180 capsule 11    levETIRAcetam (KEPPRA) 250 mg tablet TAKE 0.5 TABLET (HALF) BY MOUTH TWICE A DAY. 30 tablet 11    melatonin 10 mg TAB Take 10 mg by mouth nightly at bedtime.      metoprolol tartrate (LOPRESSOR) 50 mg tablet 1 TABLET IN THE MORNING, 1/2 TABLET AT NIGHT 135 tablet 0    mometasone (ELOCON) 0.1 % lotion Apply to scalp at night (Patient not taking: Reported on 03/21/2018) 60 mL 1    permethrin (ELIMITE) 5 % cream Apply from neck down today and then in 1 week (Patient not taking: Reported on 03/21/2018) 120 g 1    triamcinolone (KENALOG) 0.1 % ointment Apply twice daily as needed to trunk and legs (Patient not taking: Reported on 03/21/2018) 454 g 0     No current facility-administered medications on file prior to visit.            ROS:   Review of Systems   Constitutional: Negative for chills and fever.   Respiratory: Negative for shortness of breath.    Cardiovascular: Negative for chest pain and palpitations.    Gastrointestinal: Positive for abdominal pain. Negative for constipation and diarrhea.   Musculoskeletal: Positive for back pain, joint pain and myalgias.   Neurological: Positive for dizziness and seizures.   Psychiatric/Behavioral: Negative.              Exam / Objective Data    BP 130/56 (BP Location: Left upper arm, Patient Position: Sitting, Cuff Size: Adult)   Pulse 55   Wt 71.8 kg (158 lb 4.8 oz)   SpO2 98%   BMI 27.60 kg/m       Physical Exam   Constitutional: She is oriented to person, place, and time. She appears well-developed and well-nourished. No distress.   HENT:   Head: Normocephalic.   Neck: No JVD present.   Cardiovascular: Normal rate and regular rhythm. Exam reveals no gallop and no friction rub.   No murmur heard.  Pulmonary/Chest: Effort normal and breath sounds normal. No respiratory distress. She has no wheezes.   Abdominal: Soft. Bowel sounds are normal. She exhibits no distension. There is no tenderness. There is no rebound.  Musculoskeletal: Normal range of motion. She exhibits no edema.   Trace edema  Prominent varicose veins    Neurological: She is alert and oriented to person, place, and time.   Skin: Skin is warm and dry. She is not diaphoretic.   Psychiatric: She has a normal mood and affect. Her behavior is normal. Thought content normal.         Assessment and Plan:    Aimee Smith is a 72 y.o. 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    #Cirrhosis  Secondary to alcohol use, now in remission. Low platelets may be a sequelae, less likeyl ongoing use.  EtOH: abstinent 12/2013, remains abstinent   [place] screens: neg [place] 03/2014, 09/2014; neg AFP 04/2014 and 07/2014; negative 2019  Abdominal ultrasound: due, ordered previously, reminded pt.  EGD: done OSH 05/2013 no varices,    Hep A: 03/20/2014, 09/25/2014  Hep B: 03/20/2014, 04/23/2014, 09/25/2014     #Leg edema  Pt describing ongoing history of leg edema, concerned for cardiac cause. Unlikely given no evidence of systemic overload (no JVD, no SOB) and that pts symptoms are very positional and related to time of day.   - Advised pt to trial leg elevation and compression stockings  - return precautions discussed    #Pannus  Discussed with Gen Surg, no action planned at this time  - CTM    #Varicose veins  Has varicose veins that bother her, requests vascular referral.  - Referred today to Vascular    #Pain in multiple joints, intermittent difficulty with ambulating  Has pain in multiple joints. Also occasional balance issues. Given age would benefit from fall prevention and balance counseling as well as workin with her regarding her joint pain. No abnormalities on ambulation today.  - Referred to physical therapy        #Patch on nose  - Referred to Derm     #Health Maintenance  Cancer Screening  -Colonoscopy every 10 years or FIT yearly: negative fit in 2019, next 2020   -Pap every 3 years (or every 5 years if co-tested with HPV and negative once >30): Not due secondary to age  -low-dose CT if age between 54-74 and 11 pack year history: had in 2015  -Breast: mammography every 2 years starting age 39 up to age 68: last in 2017, due, ordered, reminded pt today     General  -DEXA age >72 women: uptodate from 58,normal     Cardiovascular  -AAA: [place] if age 29-75 female and current or former smoker: NA     Vaccines  -annual flu vaccine: indicated, not yet avaialble   -Tdap 1x with Td booster every 10 years: had 2011, due 2021   -Zoster for patients > 50 : needs shingrix, discussed tdoay   -Hep B vaccine if < 60, or >60 if diabetic   -Pneumococcus:  Up to date, had 2016              -Age>65, then PCV13+PPSV23             -Age< 78 with DM, current smoker, or chronic heart, lung, or liver disease,  then PPSV23 only              -AGE < 65 with  immunocompromise, PCV13+PPSV23  The following was not acutely discussed and is included below for my reference only.    #Epilepsy.  Unclear history of seizures, previously seen by Neuro who decreased her Keppra. Wonder if she might be able to come off it (especially if seizures were related to acute pancreatitis, as the chart appears to suggest). Previous neuro notes (eg 2016) mention that they think she may not need long-term anticonvulsants.   - Referred to Neurology to discuss discontinuing Keppra     #ILD  ILD was a diagnosis without clear cause. She was seen and diagnosed in 2013 with SOB and cough. A clear cause was not known and it was thought that this might be related to aspiration; and that she might possibly have achalasia. She udnerwent dilation (per Pulm note) which was useful.Currently she is asymptomatic with no shortness of breath or cough and normal pulmonary exam.  - CTM, no indication for Pulm referral today     #Moderate alcohol use disorder, in sustained remission  Remains abstinent  - Continue night-time gabapentin, no medication changes today.       [name], M.D, Ph.D  PGY2, Internal Medicine

## 2018-03-28 NOTE — Patient Instructions (Addendum)
It was a pleasure to see you today in clinic. Here are some of the things we discussed for you to do afterwards.  - Please make your mammogram appointment  - Please have your abdominal ultrasound done  - Please go to physical therapy for your leg pain.  - Please go to Dermatology for your nose  - Please try to get the Pap records sent to [place].  - Please get your Shingrix vaccine from [facility]  - Please see Vascular surgery for your varicose veins. Also try to elevate your legs at home and wear compression stockings   - Please follow up with me in four months.

## 2018-04-04 ENCOUNTER — Ambulatory Visit: Admit: 2018-04-04 | Discharge: 2018-04-04

## 2018-04-04 DIAGNOSIS — H02839 Dermatochalasis of unspecified eye, unspecified eyelid: Secondary | ICD-10-CM

## 2018-04-04 DIAGNOSIS — H02834 Dermatochalasis of left upper eyelid: Secondary | ICD-10-CM

## 2018-04-04 DIAGNOSIS — H02831 Dermatochalasis of right upper eyelid: Secondary | ICD-10-CM

## 2018-04-04 NOTE — Progress Notes (Signed)
I saw Aimee Smith today at the [facility] for cosmetic consultation to discuss upper eyelid surgery.    The patient is a 72 year old woman who met with me 2 weeks ago to discuss abdominal wall surgery and is now working on an exercise and diet plan.     In the meantime, she has noted a great deal of excess skin on her upper eyelids bilaterally. She states that this is an inherited characteristic from her mother and that the excess skin has been present for many years.  She states that it is producing hooding and she is interested in a removing some of the skin without producing any elevation of her eyebrows or change in the shape of her eyes.    The patient relates a ophthalmologic history of bilateral cataract surgery within the last few years.  She is required to wear glasses for reading and on her driver's license, and states that she has dry eye with some itchiness but does not use any eye drops. Her last ophthalmologic examination was over a year ago and she states that she is due for this.    Of note, she is scheduled for a neurologic exam for ongoing dizziness and gait disturbance.    Physical Exam    On examination, this is a fair skinned woman with thin skin and fine surface wrinkling. Her brows and forehead are relatively taut although there is some transverse forehead furrowing. There is a great deal of redundant skin on the upper eyelids bilaterally extending into the outer [place]hal areas. There is minimal skin redundancy and puffiness on the lower eyelids. There is some relaxation of the lower facial skin producing loosening around the jaw line and in the upper neck.    ASSESSMENT AND PLAN:    Using photographs, I spoke with the patient about bilateral upper eyelid blepharoplasty. I showed her the placement of incisions and explained that this would remove the excess skin and produce permanent scarring. I told her about an outpatient procedure under general anesthesia with  sutures that would require removal about 1 week after surgery. I warned her about bruising and the inability to engage socially for about 2 weeks after surgery.    I explained that the greatest risks are damage to the eyes or medical problems related to her history of interstitial lung disease and hepatic dysfunction. I explained that it would be mandatory for her to have an ophthalmologic examination to be sure that she has good general eye health and the globes are in good condition. It also would be necessary for her to work with her primary care physician to ensure that all of her medical problems including the current neurologic ones are under good control before considering anesthesia for elective surgery.    I gave the patient an educational brochure about blepharoplasty and an aspirin warning form. I invited her to contact us for further discussion after she has had the opportunity to work with her other health care providers.

## 2018-04-26 MED ORDER — METOPROLOL TARTRATE 50 MG TABLET
50 | ORAL_TABLET | ORAL | 0 refills | Status: DC
Start: 2018-04-26 — End: 2018-07-20

## 2018-04-26 NOTE — Telephone Encounter (Signed)
Current pended order:  metoprolol tartrate (LOPRESSOR) 50 mg tablet  1 TABLET IN THE MORNING, 1/2 TABLET AT NIGHT    Last time approved:  metoprolol tartrate (LOPRESSOR) 50 mg tablet  1 TABLET IN THE MORNING, 1/2 TABLET AT NIGHT  Dispense: 135 tablet Refill: 0  GEN MED MZ 1545 1 by Bing Plume DIPTI on 01/26/18      Last appointment: 03/28/2018 [name], MD  Next appointment: 07/24/2018 [name], MD    Last  BP:  BP Readings from Last 3 Encounters:   04/04/18 139/63   03/28/18 130/56   03/21/18 129/63       Last Labs:    Lab Results   Component Value Date    Creatinine 0.67 03/13/2018    Sodium, Serum / Plasma 139 03/13/2018    Potassium, Serum / Plasma 4.3 03/13/2018    Hemoglobin A1c 5.7 (H) 03/13/2018    Cholesterol, Total 196 07/11/2015    LDL Cholesterol 132 (H) 07/11/2015    Cholesterol, HDL 48 07/11/2015    Thyroid Stimulating Hormone 2.77 02/19/2016    Hemoglobin 13.7 03/13/2018    eGFR if non-African American 88 03/13/2018    eGFR if African Amer 102 03/13/2018       If patient has not been seen in clinic for 12 months or more I have routed encounter to the administrative team to book a follow-up appointment.

## 2018-04-27 ENCOUNTER — Ambulatory Visit: Admit: 2018-04-27 | Discharge: 2018-04-27 | Payer: MEDICARE

## 2018-04-27 DIAGNOSIS — L578 Other skin changes due to chronic exposure to nonionizing radiation: Secondary | ICD-10-CM

## 2018-04-27 DIAGNOSIS — D225 Melanocytic nevi of trunk: Secondary | ICD-10-CM

## 2018-04-27 DIAGNOSIS — M25511 Pain in right shoulder: Secondary | ICD-10-CM

## 2018-04-27 DIAGNOSIS — I8390 Asymptomatic varicose veins of unspecified lower extremity: Secondary | ICD-10-CM

## 2018-04-27 DIAGNOSIS — L821 Other seborrheic keratosis: Secondary | ICD-10-CM

## 2018-04-27 NOTE — Progress Notes (Signed)
04/28/18    ATTESTATION:    My date of service is 04/27/2018. I was present for and performed key portions of an examination of the patient.   I am personally involved in the management of the patient. I agree with the findings and care plans as documented.

## 2018-04-27 NOTE — Progress Notes (Signed)
Subjective   Chief Complaint   Patient presents with    New Patient Evaluation     nose and thigh lesion     History of Present Illness:  Aimee Smith is a 72 y.o. female with HPI as follows:  New patient    Presents today for evaluation of persistent asymptomatic lesion on L nose for years  Prev rx LN2, but comes back    Also itchy legs in setting of varicose veins, ongoing for years    Would like fbse    Personal Skin Cancer History   Melanoma No   Nonmelanoma skin cancer Yes     Details of Personal Skin Cancer Hx     Diagnosis Date Comment Source    Nonmelanoma skin cancer   Provider        Family Skin Cancer Hx     Problem Relation (Age of Onset)    Basal cell carcinoma Sister    Skin ca. unk/oth Father    Squamous cell carcinoma Neg Hx        Patient's allergies, medications, past medical, surgical, family and social histories were reviewed and updated as appropriate., Aimee Smith allergic to morphine.,   Current Outpatient Medications   Medication Sig Dispense Refill    gabapentin (NEURONTIN) 300 mg capsule TAKE 1 CAPSULE BY MOUTH 3 TIMES DAILY AND 2 CAPS AT BEDTIME 180 capsule 11    levETIRAcetam (KEPPRA) 250 mg tablet TAKE 0.5 TABLET (HALF) BY MOUTH TWICE A DAY. 30 tablet 11    melatonin 10 mg TAB Take 10 mg by mouth nightly at bedtime.      metoprolol tartrate (LOPRESSOR) 50 mg tablet 1 TABLET IN THE MORNING, 1/2 TABLET AT NIGHT 135 tablet 0    mometasone (ELOCON) 0.1 % lotion Apply to scalp at night (Patient not taking: Reported on 03/21/2018) 60 mL 1    permethrin (ELIMITE) 5 % cream Apply from neck down today and then in 1 week (Patient not taking: Reported on 03/21/2018) 120 g 1    triamcinolone (KENALOG) 0.1 % ointment Apply twice daily as needed to trunk and legs (Patient not taking: Reported on 03/21/2018) 454 g 0     No current facility-administered medications for this visit.     and Aimee Smith  has a past medical history of Achalasia, Arrhythmia, Depression, GERD (gastroesophageal reflux disease),  Headache, Hypothyroidism (07/11/2015), ILD (interstitial lung disease) ([place]), Liver disease, Meniere's disease, Moderate alcohol use disorder, in early remission ([place]) (01/15/2014), Nonmelanoma skin cancer, Osteoarthritis of right hip, Pancreatitis, Sciatica of right side (03/20/2014), Seizures ([place]), and Thrombocytopenia ([place]). She also has no past medical history of Allergic state, Anemia, Anxiety, Asthma, Bleeding disorder ([place]), Blood transfusion without reported diagnosis, Cancer ([place]), CHF (congestive heart failure) ([place]), Chronic kidney disease, COPD (chronic obstructive pulmonary disease) ([place]), Coronary artery disease, Diabetes mellitus ([place]), Glaucoma, Heart disease, Human immunodeficiency virus (HIV) disease ([place]), Intestinal disease, Melanoma ([place]), Myocardial infarction ([place]), Osteoporosis, Sinus disorder, Skin disease, Stroke ([place]), or Ulcer.    Review of Systems   Constitutional: Negative.    Skin/Breast: Positive for skin pruritus.          Objective   Physical Examination:  Skin elements examined with findings:    Head    Neck    Chest & Axillae    Abdomen    Back    R Upper Extremity    L Upper Extremity    R Lower Extremity    L Lower Extremity.  Skin exam findings:  Numerous scattered stuck-on papules and plaques on face, trunk and extremities.  Scattered uniform regular appearing nevi on trunk and extremities. None with concerning dermatoscopic features.  Moderate dermatoheliosis  Prominent superficial and deep varicosities on BLE        Additional Exam Findings    General Appearance negative: Well-appearing, well-nourished, with no obvious deformities.     Orientation negative: Oriented to time, place and person.     Mood & Affect negative: No evidence of depression, mania, anxiety, or agitation.     Digits & Nails negative: No clubbing, cyanosis, inflammation, petechiae, ischemia, or infections.       Data Reviewed:          Assessment   Assessment and Plan (numbered by problem):  Seborrheic  keratoses- Patient counseled on diagnosis, prognosis, rationale for intervention when indicated, and signs that warrant re-evaluation. Pt declines treatment of lesion on L nose.    [place]-   Regular appearing nevi with no concerning features on today's exam. Patient counseled on ABCDEs and signs that warrant re-evaluation.     Dermatoheliosis-  The importance of photoprotection, including sunscreen SPF45, wide-brim hat, photo-protective clothing and photo-avoidance were discussed at length today.    Varicosities, ?phleboliths given LE pain  Pt to f/u with vascular surgeon      Minor Procedure Notes (if applicable):     Counseling:  Assessment and Plan were discussed.  Nancy Fetter Protection, Skin Cancer Education, & Skin Self-exam discussed  Gentle skin care education discussed  Details of diagnosis and prognosis discussed

## 2018-04-27 NOTE — Patient Instructions (Addendum)
-- It was very nice to see you today!  Please send me a message via MyChart anytime with any questions about your health, or call our office with any urgent concerns.    -- You most likely have a strain of your rotator cuff muscles. Please take ibuprofen or acetaminophen and put ice on the shoulder. Take a break from golf until your shoulder heals    -- Please get your x-ray of your shoulder  Today we have ordered an x-ray. You do not need an appointment for this examination.  (You do need an appointment for other radiology studies such as mammograms, bone density tests, CT scans, and MRIs.     Radiology at [facility] [place] [address]  Phone [phone],   Hours: Monday-Friday, 7:30 a.m.-5:30 p.m    Radiology at [facility], [place] [address]  Phone: [phone]  Hours: Monday to Friday 7:30 a.m.  5:30 p.m.    When I receive your report, I will contact you via MyChart or send you a letter by mail. Of course, if there are any urgent findings, you will hear directly from me or a nurse on my team.      Rotator Cuff Injury: Care Instructions  Your Care Instructions  The rotator cuff is a group of tendons and muscles around the shoulder that keeps the upper arm bone in place. It keeps the shoulder joint stable and allows you to raise and rotate your arm.  Damage to the rotator cuff can be caused by overuse, a fall, or a direct blow to the shoulder area, which can tear the tendons. Over time, everyday wear can damage the tendons and make injury more likely.  Treatment can depend upon the amount of damage to the tendons. In a younger person, surgery may be the first choice. But if you are older than 25, you likely have some wear on your rotator cuff. Surgery may not be the most effective treatment. Your doctor may have you try physical therapy and exercise first.  Follow-up care is a key part of your treatment and safety. Be sure to make  and go to all appointments, and call your doctor if you are having problems. It's also a good idea to know your test results and keep a list of the medicines you take.  How can you care for yourself at home?   Rest your shoulder as much as you can. If your doctor put your arm in a sling or shoulder immobilizer, wear it as directed. Do not take it off before your doctor tells you to. If it is too tight, loosen it.   Be safe with medicines. Read and follow all instructions on the label.  ? If the doctor gave you a prescription medicine for pain, take it as prescribed.  ? If you are not taking a prescription pain medicine, ask your doctor if you can take an over-the-counter medicine.   Put ice or a cold pack on your shoulder for 10 to 20 minutes at a time. Try to do this every 1 to 2 hours for the next 3 days (when you are awake). Put a thin cloth between the ice pack and your skin.   After 3 days, put a warm, wet towel on your shoulder. This is to relax the muscles and help blood flow.   While holding a warm, wet towel on your shoulder, lean  forward so your arm hangs freely, and gently swing your arm back and forth like a pendulum. You also can do this standing under a warm shower.   Do not do anything that makes your pain worse.   Follow your doctor's advice about whether you need physical therapy.  When should you call for help?  Call your doctor now or seek immediate medical care if:    You have severe pain.     You cannot move your shoulder or arm.     You have tingling or numbness in your arm or hand.     Your arm or hand is cool or pale.   Watch closely for changes in your health, and be sure to contact your doctor if:    Your pain gets worse.     You have new or worse swelling in your arm or hand.     You do not get better as expected.   Where can you learn more?  Go to https://www.bennett.info/.  Enter D027 in the search box to learn more about "Rotator Cuff Injury: Care  Instructions."  Current as of: April 28, 2017  Content Version: 12.1   2006-2019 [employer]. Care instructions adapted under license by your healthcare professional. If you have questions about a medical condition or this instruction, always ask your healthcare professional. [place] any warranty or liability for your use of this information.      Rotator Cuff: Exercises  Introduction  Here are some examples of exercises for you to try. The exercises may be suggested for a condition or for rehabilitation. Start each exercise slowly. Ease off the exercises if you start to have pain.  You will be told when to start these exercises and which ones will work best for you.  How to do the exercises  Pendulum swing    1. Hold on to a table or the back of a chair with your good arm. Then bend forward a little and let your sore arm hang straight down. This exercise does not use the arm muscles. Rather, use your legs and your hips to create movement that makes your arm swing freely.  2. Use the movement from your hips and legs to guide the slightly swinging arm back and forth like a pendulum (or elephant trunk). Then guide it in circles that start small (about the size of a dinner plate). Make the circles a bit larger each day, as your pain allows.  3. Do this exercise for 5 minutes, 5 to 7 times each day.  4. As you have less pain, try bending over a little farther to do this exercise. This will increase the amount of movement at your shoulder.    Posterior stretching exercise    1. Hold the elbow of your injured arm with your other hand.  2. Use your hand to pull your injured arm gently up and across your body. You will feel a gentle stretch across the back of your injured shoulder.  3. Hold for at least 15 to 30 seconds. Then slowly lower your arm.  4. Repeat 2 to 4 times.    Up-the-back stretch    1. Put your hand in your back pocket. Let it rest there to stretch your shoulder.   2. With your other hand, hold your injured arm (palm outward) behind your back by the wrist. Pull your arm up gently to stretch your shoulder.  3. Next, put a towel over your other shoulder.  Put the hand of your injured arm behind your back. Now hold the back end of the towel. With the other hand, hold the front end of the towel in front of your body. Pull gently on the front end of the towel. This will bring your hand farther up your back to stretch your shoulder.    Overhead stretch    1. Standing about an arm's length away, grasp onto a solid surface. You could use a countertop, a doorknob, or the back of a sturdy chair.  2. With your knees slightly bent, bend forward with your arms straight. Lower your upper body, and let your shoulders stretch.  3. As your shoulders are able to stretch farther, you may need to take a step or two backward.  4. Hold for at least 15 to 30 seconds. Then stand up and relax. If you had stepped back during your stretch, step forward so you can keep your hands on the solid surface.  5. Repeat 2 to 4 times.    Shoulder flexion (lying down)    1. Lie on your back, holding a wand with both hands. Your palms should face down as you hold the wand.  2. Keeping your elbows straight, slowly raise your arms over your head. Raise them until you feel a stretch in your shoulders, upper back, and chest.  3. Hold for 15 to 30 seconds.  4. Repeat 2 to 4 times.    Shoulder rotation (lying down)    1. Lie on your back. Hold a wand with both hands with your elbows bent and palms up.  2. Keep your elbows close to your body, and move the wand across your body toward the sore arm.  3. Hold for 8 to 12 seconds.  4. Repeat 2 to 4 times.    Wall climbing (to the side)    1. Stand with your side to a wall so that your fingers can just touch it at an angle about 30 degrees toward the front of your body.  2. Walk the fingers of your injured arm up the wall as high as pain permits. Try not to shrug your shoulder  up toward your ear as you move your arm up.  3. Hold that position for a count of at least 15 to 20.  4. Walk your fingers back down to the starting position.  5. Repeat at least 2 to 4 times. Try to reach higher each time.    Wall climbing (to the front)    1. Face a wall, and stand so your fingers can just touch it.  2. Keeping your shoulder down, walk the fingers of your injured arm up the wall as high as pain permits. (Don't shrug your shoulder up toward your ear.)  3. Hold your arm in that position for at least 15 to 30 seconds.  4. Slowly walk your fingers back down to where you started.  5. Repeat at least 2 to 4 times. Try to reach higher each time.    Shoulder blade squeeze    1. Stand with your arms at your sides, and squeeze your shoulder blades together. Do not raise your shoulders up as you squeeze.  2. Hold 6 seconds.  3. Repeat 8 to 12 times.    Scapular exercise: Arm reach    1. Lie flat on your back. This exercise is a very slight motion that starts with your arms raised (elbows straight, arms straight).  2. From this position,  reach higher toward the sky or ceiling. Keep your elbows straight. All motion should be from your shoulder blade only.  3. Relax your arms back to where you started.  4. Repeat 8 to 12 times.    Arm raise to the side    1. Slowly raise your injured arm to the side, with your thumb facing up. Raise your arm 60 degrees at the most (shoulder level is 90 degrees).  2. Hold the position for 3 to 5 seconds. Then lower your arm back to your side. If you need to, bring your "good" arm across your body and place it under the elbow as you lower your injured arm. Use your good arm to keep your injured arm from dropping down too fast.  3. Repeat 8 to 12 times.  4. When you first start out, don't hold any extra weight in your hand. As you get stronger, you may use a 1-pound to 2-pound dumbbell or a small can of food.    Shoulder flexor and extensor exercise    1. Push forward (flex):  Stand facing a wall or doorjamb, about 6 inches or less back. Hold your injured arm against your body. Make a closed fist with your thumb on top. Then gently push your hand forward into the wall with about 25% to 50% of your strength. Don't let your body move backward as you push. Hold for about 6 seconds. Relax for a few seconds. Repeat 8 to 12 times.  2. Push backward (extend): Stand with your back flat against a wall. Your upper arm should be against the wall, with your elbow bent 90 degrees (your hand straight ahead). Push your elbow gently back against the wall with about 25% to 50% of your strength. Don't let your body move forward as you push. Hold for about 6 seconds. Relax for a few seconds. Repeat 8 to 12 times.    Scapular exercise: Wall push-ups    1. Stand facing a wall, about 12 inches to 18 inches away.  2. Place your hands on the wall at shoulder height.  3. Slowly bend your elbows and bring your face to the wall. Keep your back and hips straight.  4. Push back to where you started.  5. Repeat 8 to 12 times.  6. When you can do this exercise against a wall comfortably, you can try it against a counter. You can then slowly progress to the end of a couch, then to a sturdy chair, and finally to the floor.    Scapular exercise: Retraction    1. Put the band around a solid object at about waist level. (A bedpost will work well.) Each hand should hold an end of the band.  2. With your elbows at your sides and bent to 90 degrees, pull the band back. Your shoulder blades should move toward each other. Then move your arms back where you started.  3. Repeat 8 to 12 times.  4. If you have good range of motion in your shoulders, try this exercise with your arms lifted out to the sides. Keep your elbows at a 90-degree angle. Raise the elastic band up to about shoulder level. Pull the band back to move your shoulder blades toward each other. Then move your arms back where you started.    Internal rotator  strengthening exercise    1. Start by tying a piece of elastic exercise material to a doorknob. You can use surgical tubing or Thera-Band.  2.  Stand or sit with your shoulder relaxed and your elbow bent 90 degrees. Your upper arm should rest comfortably against your side. Squeeze a rolled towel between your elbow and your body for comfort. This will help keep your arm at your side.  3. Hold one end of the elastic band in the hand of the painful arm.  4. Slowly rotate your forearm toward your body until it touches your belly. Slowly move it back to where you started.  5. Keep your elbow and upper arm firmly tucked against the towel roll or at your side.  6. Repeat 8 to 12 times.    External rotator strengthening exercise    1. Start by tying a piece of elastic exercise material to a doorknob. You can use surgical tubing or Thera-Band. (You may also hold one end of the band in each hand.)  2. Stand or sit with your shoulder relaxed and your elbow bent 90 degrees. Your upper arm should rest comfortably against your side. Squeeze a rolled towel between your elbow and your body for comfort. This will help keep your arm at your side.  3. Hold one end of the elastic band with the hand of the painful arm.  4. Start with your forearm across your belly. Slowly rotate the forearm out away from your body. Keep your elbow and upper arm tucked against the towel roll or the side of your body until you begin to feel tightness in your shoulder. Slowly move your arm back to where you started.  5. Repeat 8 to 12 times.    Follow-up care is a key part of your treatment and safety. Be sure to make and go to all appointments, and call your doctor if you are having problems. It's also a good idea to know your test results and keep a list of the medicines you take.  Where can you learn more?  Go to https://www.bennett.info/.  Enter J005 in the search box to learn more about "Rotator Cuff: Exercises."  Current as of: April 28, 2017  Content Version: 12.1   2006-2019 Healthwise, Incorporated. Care instructions adapted under license by your healthcare professional. If you have questions about a medical condition or this instruction, always ask your healthcare professional. [place] any warranty or liability for your use of this information.    Learning About RICE (Rest, Ice, Compression, and Elevation)  What is RICE?    RICE is a way to care for an injury. RICE helps relieve pain and swelling. It may also help with healing and flexibility. RICE stands for:   Rest and protect the injured or sore area.   Ice or a cold pack used as soon as possible.   Compression, or wrapping the injured or sore area with an elastic bandage.   Elevation (propping up) the injured or sore area.  How do you do RICE?  You can use RICE for home treatment when you have general aches and pains or after an injury or surgery.  Rest   Do not put weight on the injury for at least 24 to 48 hours.   Use crutches for a badly sprained knee or ankle.   Support a sprained wrist, elbow, or shoulder with a sling.  Ice   Put ice or a cold pack on the injury right away to reduce pain and swelling. Frozen vegetables will also work as an ice pack. Put a thin cloth between the ice or cold pack and your skin. The  cloth protects the injured area from getting too cold.   Use ice for 10 to 15 minutes at a time for the first 48 to 72 hours.  Compression   Use compression for sprains, strains, and surgeries of the arms and legs.   Wrap the injured area with an elastic bandage or compression sleeve to reduce swelling.   Don't wrap it too tightly. If the area below it feels numb, tingles, or feels cool, loosen the wrap.  Elevation   Use elevation for areas of the body that can be propped up, such as arms and legs.   Prop up the injured area on pillows whenever you use ice. Keep it propped up anytime you sit or lie down.   Try to keep the injured  area at or above the level of your heart. This will help reduce swelling and bruising.  Where can you learn more?  Go to https://www.bennett.info/.  Enter [phone] in the search box to learn more about "Learning About RICE (Rest, Ice, Compression, and Elevation)."  Current as of: April 28, 2017  Content Version: 12.1   2006-2019 Healthwise, Incorporated. Care instructions adapted under license by your healthcare professional. If you have questions about a medical condition or this instruction, always ask your healthcare professional. [place] any warranty or liability for your use of this information.

## 2018-04-27 NOTE — Progress Notes (Signed)
I reviewed the recent tests. There is nothing concerning about the results.

## 2018-04-27 NOTE — Progress Notes (Signed)
Subjective    Aimee Smith is a 72 y.o. 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 Right shoulder pain   - Played in golf tournament on Monday  - While making a turn, developed acute right shoulder pain.  - Immediately afterwards, she had no pain, but the next day, developed shoulder pain  - No pain when not moving the shoulder. Pain occurs when moving the shoulder and arm up. Develops excruciating 8/210 sharp pain    - Does not have prior injury in the area  - Has not tried any analgesics or ice. Has been trying to move shoulder as little as possible  - Denies weakness    I reviewed the patient's pertinent allergies, medications, past medical history, social history and family history from the history section of the electronic health record and made updates as appropriate.    Review of Systems   Constitutional: Negative for chills and fever.   Gastrointestinal: Negative for abdominal pain.   Musculoskeletal:        +shoulder pain   Neurological: Negative for weakness.        All other systems were reviewed and are negative.    Objective      Vitals:    04/27/18 1541   BP: 134/59   BP Location: Left upper arm   Patient Position: Sitting   Cuff Size: Adult   Pulse: 59   Resp: 16   SpO2: 100%     There is no height or weight on file to calculate BMI.    Physical Exam   Constitutional: She appears well-developed and well-nourished.   HENT:   Head: Normocephalic.   Eyes: Right eye exhibits no discharge. Left eye exhibits no discharge.   Neck: Neck supple.   Cardiovascular: Normal rate and regular rhythm. Exam reveals no gallop and no friction rub.   No murmur heard.  Pulmonary/Chest: Effort normal. No respiratory distress. She has no wheezes. She  has no rales.   Abdominal: Soft. She exhibits no distension. There is no tenderness. There is no rebound and no guarding.   Musculoskeletal: She exhibits no edema.   Shoulder is symmetric. No pain on palpation of clavicle, AC joint, or scapula. Full range of motion of shoulder. Pain with empty can test but no weakness. Negative drop arm test. Negative Neer's test. Negative Hawkins test.    Neurological: She is alert.   Skin: Skin is warm.        Review of Prior Testing  No visits with results within 1 Month(s) from this visit.   Latest known visit with results is:   Hospital Outpatient Visit on 03/17/2018   Component Date Value    Fecal Occult Blood Test 03/17/2018 NEG      Assessment and Plan      Aimee Smith is a 72 y.o. Female, patient of Dr. 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     #Acute right shoulder pain  Several days of right shoulder pain after incident during golfing. Exam notable for pain when simultaneously abducting and internally rotating the  right arm. Otherwise, had intact range of motion, strength, and negative provocative tests. Presentation most consistent with rotator cuff sprain, likely of the supraspinatus. However, given acute injury and patient's older age and history of steroid use, will obtain x-ray to rule out fracture.   [ ]  R shoulder x-ray  - Ibuprofen or acetaminophen as needed for pain  - RICE  - Rotator cuff exercises provided to patient, counseled to perform to avoid frozen shoulder  - F/u in 4-6 weeks with PCP

## 2018-05-01 ENCOUNTER — Ambulatory Visit: Admit: 2018-05-01 | Discharge: 2018-05-01 | Payer: MEDICARE | Attending: Neurology

## 2018-05-01 DIAGNOSIS — R2689 Other abnormalities of gait and mobility: Secondary | ICD-10-CM

## 2018-05-01 DIAGNOSIS — Z79899 Other long term (current) drug therapy: Secondary | ICD-10-CM

## 2018-05-01 DIAGNOSIS — G40009 Localization-related (focal) (partial) idiopathic epilepsy and epileptic syndromes with seizures of localized onset, not intractable, without status epilepticus: Secondary | ICD-10-CM

## 2018-05-01 NOTE — Patient Instructions (Signed)
1. Work on balance exercises (stand on one foot and use the opposite hand to hold onto a counter and try to stand for a split second with the hand off the counter)    2. Work with physical therapy to assess balance and gait and right shoulder and hip and develop some exercises. Ask them about what limitations you should take when you begin working with trainer    3. Hire a physical trainer through [employer] (home visits or at the [facility]) to work balance, core strengthening    I feel that gabapentin might not be necessary anymore and so we should try to reduce to 1 capsule at bedtime.   After 1 month, see if you can stop gabapentin.. If you can't stop gabapentin, let me know because I would then prescribe gabapentin 100 mg capsules to help you slowly taper.    Venita Sheffield, MD  [phone] (voice mail)  [phone] (main number)

## 2018-05-01 NOTE — Progress Notes (Signed)
Subjective:      Aimee Smith is a 72 y.o. right handed female here for followup. Last clinic visit was 2017.    she denies any episodes of unresponsiveness, confusion, loss of time, subjective sensory events, unexplained self-injury or incontinence.    She has been taking low dose levetiracetam for the last two years without clear side effects and she has not had any of those "sudden feelings" that used to come over her. She was wondering if she could taper off levetiracetam.     She has remained sober for over 3 years.   She has been more active. She drives independently and has no problems and no accidents or near misses. She drives down to [facility] twice a week to visit a friend who lives in a nursing facility. She drives to run errands. She was recently in a [employer] tournament and was very pleased with how she was doing but then ended up injuring her shoulder.     She would like to lose weight and have even more energy. She feels her balance is not good and is quite tentative and afraid of falling, especially when she has to step off a curve or walk on uneven ground.     We reviewed her daily schedule. She stays up quite late, partly because she is afraid that she will wake up in the middle of the night. She ends up watching TV until 1-2 am and then wakes up at 6 am. She doesn't take naps. She takes gabapentin and melatonin at around 12 midnight and she is not sure if they are helping at all. Her main problem has not been falling asleep but staying asleep. She is not sure what gabapentin is doing for her.          [name] is allergic to morphine.    Medications the patient states to be taking prior to today's encounter.   Medication Sig    gabapentin (NEURONTIN) 300 mg capsule TAKE 1 CAPSULE BY MOUTH 3 TIMES DAILY AND 2 CAPS AT BEDTIME (Patient taking differently: Takes 2 capsules at bedtime)    levETIRAcetam (KEPPRA) 250 mg tablet TAKE 0.5 TABLET (HALF) BY MOUTH TWICE A DAY.    melatonin 10 mg TAB  Take 10 mg by mouth nightly at bedtime.    metoprolol tartrate (LOPRESSOR) 50 mg tablet 1 TABLET IN THE MORNING, 1/2 TABLET AT NIGHT       Objective:   The patient was a good historian with normal speech/language in conversation.  Okay joint position sense in toes. No lesions over feet. Can walk on heels and toes.   Slightly wide stance. Difficulty with tandem and standing on one foot independently.   Assessment:     Focal epilepsy, cryptogenic  Seizures fully controlled on low dose levetiracetam, I am not recommending coming off levetiracetam at this time because patient is driving and cannot take a break from driving  Gait imbalance  Plan:   We talked about pros/cons of gabapentin and we decided to try tapering off gabapentin slowly over the next 2 months. We discussed balance exercises and ways for her to improve her balance including physical therapy and then eventually transitioning to a physical trainer.     I spent a total of 25 min. face to face with the patient, of which 15 min. was in counseling and coordination of care

## 2018-05-23 ENCOUNTER — Ambulatory Visit: Admit: 2018-05-23 | Discharge: 2018-05-23 | Payer: MEDICARE

## 2018-05-23 DIAGNOSIS — G40909 Epilepsy, unspecified, not intractable, without status epilepticus: Secondary | ICD-10-CM

## 2018-05-23 DIAGNOSIS — R0601 Orthopnea: Secondary | ICD-10-CM

## 2018-05-23 DIAGNOSIS — K7030 Alcoholic cirrhosis of liver without ascites: Secondary | ICD-10-CM

## 2018-05-23 DIAGNOSIS — K7460 Unspecified cirrhosis of liver: Secondary | ICD-10-CM

## 2018-05-23 DIAGNOSIS — R600 Localized edema: Secondary | ICD-10-CM

## 2018-05-23 DIAGNOSIS — Z0000 Encounter for general adult medical examination without abnormal findings: Secondary | ICD-10-CM

## 2018-05-23 DIAGNOSIS — M7989 Other specified soft tissue disorders: Secondary | ICD-10-CM

## 2018-05-23 DIAGNOSIS — J849 Interstitial pulmonary disease, unspecified: Secondary | ICD-10-CM

## 2018-05-23 DIAGNOSIS — I872 Venous insufficiency (chronic) (peripheral): Secondary | ICD-10-CM

## 2018-05-23 DIAGNOSIS — R569 Unspecified convulsions: Secondary | ICD-10-CM

## 2018-05-23 MED ORDER — FLU VACCINE TS2019-20(65YR UP)(PF)180 MCG/0.5 ML INTRAMUSCULAR SYRINGE
180 mcg/0.5 mL | INTRAMUSCULAR | Status: AC
  Administered 2018-05-23: 23:00:00 0.5 mL via INTRAMUSCULAR

## 2018-05-23 NOTE — Assessment & Plan Note (Signed)
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  Today  MELD score is 6..   Hepatocellular carcinoma surveillance:  The patient should undergo alternating abdominal ultrasound with dopplers, CT of the abdomen and pelvis with IV contrast, or MRI, and have a serum alfa-fetoprotein checked every 6 months to provide surveillance for the development of hepatocellular cancer.   -AFP last normal 2019, due Jan 2020.   -Due for abdominal ultrasound now, reminded pt.  Portal hypertension with risk of esophageal varices: has NOT had variceal bleeding.   -Last endoscopy was in 2015    -Due for endoscopy, will address at next visit (prioritizing abd ultrasound).   Fluid retention:  The patient should adhere to a 2 gram sodium diet.   Hepatic encephalopathy:   no evidence today, no hx.   Healthcare maintenance:    The patient should be vaccinated against hepatitis A and B if she is not already immune.  DXA scan should be performed, given the risk of osteodystrophy/osteoporosis in patients with chronic liver disease.  Vitamin D levels should be checked annually.     - vitamin D to be obtained at next visit   -DEXA done 2016  Nutritional Assessment:  Patient appears well developed and well nourished.  No need for a dietary consult at this time.   Pharmacy Assessment:  Current medications reviewed and discussed with the patient.  No need for a pharmacy consult at this time.

## 2018-05-23 NOTE — Patient Instructions (Addendum)
It was a pleasure to see you in clinic today. Here are some of the things we discussed for you to do afterwards:  - Please come and see in 6 months.   - Please get your blood tests done.   -Please get your abdominal ultrasound done.  -Please get your mammogram done.

## 2018-05-23 NOTE — Assessment & Plan Note (Signed)
Pt describing ongoing history of leg edema, concerned for cardiac cause. Unlikely given no evidence of systemic overload (no JVD, no SOB) and that pts symptoms are very positional and related to time of day.   - obtain TSH and BNP to r/u thyroid or cardiac  - continue to encourage pt to use compression stockings

## 2018-05-23 NOTE — Progress Notes (Addendum)
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     Last seen by me 03/13/2018  Interval history:  - Seen by plastic surg for consideration of abdominoplasty   - Seen by derm for nose lsion, thought to be SK  - Acute visit for right shoulder pain 04/27/2018 for pain developed while golfing. X ray negative. Provided with rotator cuff exercises to avoid frozen shoulder   - Seen by Neuro who felt we could taper her off gabapetin but they felt she should stay on Keppra as she is unable to stop driving safely   - Has not had ultrasound or mammogram     #Shoulder pain  Hasn't played golf since but reports thast she has normal range of motion now. Hopeful that the pain has resolved.     #Seizures  Tapering gabapentin (see recent neuro note) but keeping the Keppra on.. Neuro doesn't think it was the [place] which makes her balance off.      #Drooping eyelids  Saw plastic surgery.   - Needs ophtho referral.     #Shingles  States she had an episode of shingles and was on valacyclavir, now discontinued. Was seen as an urgent care.     Meds:     Current Outpatient Medications on File Prior to Visit   Medication Sig Dispense Refill    gabapentin (NEURONTIN) 300 mg capsule TAKE 1 CAPSULE BY MOUTH 3 TIMES DAILY AND 2 CAPS AT BEDTIME (Patient taking differently: Takes 2 capsules at bedtime) 180 capsule 11    levETIRAcetam (KEPPRA) 250 mg tablet TAKE 0.5 TABLET (HALF) BY MOUTH TWICE A DAY. 30 tablet 11    melatonin 10 mg TAB Take 10 mg by mouth nightly at bedtime.      metoprolol tartrate (LOPRESSOR) 50 mg tablet 1 TABLET IN THE MORNING, 1/2 TABLET AT NIGHT 135 tablet 0     No current facility-administered medications on file prior to visit.             ROS:   Review of Systems   Constitutional: Negative.    Respiratory: Negative.    Cardiovascular: Positive for leg swelling. Negative for chest pain, palpitations, orthopnea and PND.   Gastrointestinal: Negative.    Genitourinary: Negative.    Musculoskeletal: Positive for back pain, joint pain and neck pain.   Neurological: Positive for dizziness. Negative for seizures and loss of consciousness.   Psychiatric/Behavioral: Negative.              Exam / Objective Data    BP 127/61 (BP Location: Left upper arm, Patient Position: Sitting, Cuff Size: Adult)   Pulse 57   Temp 36.3 C (97.4 F) (Oral)   Wt 71.2 kg (156 lb 14.4 oz)   SpO2 99%   BMI 27.79 kg/m       Physical Exam  Constitutional:       Appearance: Normal appearance.   HENT:      Head: Normocephalic and atraumatic.      Mouth/Throat:      Mouth: Mucous membranes are moist.   Neck:      Musculoskeletal: Normal range of motion and neck supple.   Cardiovascular:      Rate and Rhythm:  Normal rate and regular rhythm.      Heart sounds: Normal heart sounds. No murmur. No friction rub. No gallop.    Pulmonary:      Effort: Pulmonary effort is normal. No respiratory distress.      Breath sounds: Normal breath sounds. No wheezing.   Abdominal:      General: There is no distension.      Palpations: Abdomen is soft.      Tenderness: There is no tenderness.      Hernia: A hernia is present.   Musculoskeletal:      Comments: Bilateral 1+ nonpitting edema   Skin:     General: Skin is warm and dry.      Coloration: Skin is not jaundiced or pale.      Comments: Several small very faint macules around her back near paraspinal area, NT, no vesiculation    Neurological:      General: No focal deficit present.      Mental Status: She is alert and oriented to person, place, and time.   Psychiatric:         Mood and Affect: Mood normal.         Behavior: Behavior normal.         Thought Content: Thought content normal.       XR SHOULDER 2+ VIEWS, RIGHT   04/27/2018  5:13 PM    CLINICAL HISTORY:  71F hx of ILD, achalsia w/ right shoulder pain after golfing. R/o fracture    COMPARISON: None    IMPRESSION:     There is no acute fracture or dislocation involving the right shoulder. 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  Today  MELD score is 6..   Hepatocellular carcinoma surveillance:  The patient should undergo alternating abdominal ultrasound with dopplers, CT of the abdomen and pelvis with IV contrast, or MRI, and have a serum alfa-fetoprotein checked every 6 months to provide surveillance for the development of hepatocellular cancer.   -AFP last normal 2019, due Jan 2020.   -Due for abdominal ultrasound now, reminded pt.  Portal hypertension with risk of esophageal varices: has NOT had variceal bleeding.   -Last endoscopy was in 2015    -Due for endoscopy, will address at next visit (prioritizing abd ultrasound).   Fluid retention:  The patient should adhere to a 2 gram sodium diet.   Hepatic encephalopathy:   no evidence today, no hx.   Healthcare maintenance:    The patient should be vaccinated against hepatitis A and B if she is not already immune.  DXA scan should be performed, given the risk of osteodystrophy/osteoporosis in patients with chronic liver disease.  Vitamin D levels should be checked annually.     - vitamin D to be obtained at next visit   -DEXA done 2016  Nutritional Assessment:  Patient appears well developed and well nourished.  No need for a dietary consult at this time.   Pharmacy Assessment:  Current medications reviewed and discussed with the patient.   No need for a pharmacy consult at this time.      #Leg edema  Pt describing ongoing history of leg edema, concerned for cardiac cause. Unlikely given no evidence of systemic overload (no JVD, no SOB) and that pts symptoms are very positional and related to time of day.   - obtain TSH and BNP to r/u thyroid or cardiac  - continue to encourage pt to use compression stockings    #Epilepsy.  Seen by Neurology, who are titrating off her gabapentin but do not think she can come off the [place] as there is no period in which she can stop driving her car.  - continue keppra  - Gabapentin may have been started as a medicine to help with alcohol use disorder, but given she has been in remission for all this time no objection to titrating it off.     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 She was seen and diagnosed in 2013 with SOB and cough. A clear cause was not known and it was thought that this might be related to aspiration; and that she might possibly have achalasia. She udnerwent dilation (per Pulm note) which was useful.Currently she is asymptomatic with no shortness of breath or cough and normal pulmonary exam.  - CTM, no indication for Pulm referral today     #Varicose veins  Has varicose veins that bother her. Vascular referral already in.  - encouraged pt to f/u with Vascular    #Pain in multiple joints, intermittent difficulty with ambulating  Has pain in multiple joints. Also occasional balance issues. Given age would benefit from fall prevention and balance counseling as well as workin with her regarding her joint pain. No abnormalities on ambulation today.  - Referred to physical therapy previously.    #Moderate alcohol use disorder, in sustained remission  Remains abstinent  - titrating off gabapentin as above    #Patch on nose  Per Derm likely SK, NTD.    #Health Maintenance  Cancer Screening  -Colonoscopy every 10 years or FIT yearly: negative fit in 2019, next 2020   -Pap every 3 years (or  every 5 years if co-tested with HPV and negative once >30): Not due secondary to age  -low-dose CT if age between 80-74 and 59 pack year history: had in 2015  -Breast: mammography every 2 years starting age 26 up to age 41:last in 2017, due, ordered, reminded pt today    General  -DEXA age >77 women: uptodate from 18,normal    Cardiovascular  -AAA: [place] if age 33-75 female and current or former smoker: NA    Vaccines  -annual flu vaccine: ordered today  -Tdap 1x with Td booster every 10 years: had 2011, due 2021   -Zoster for patients > 50: had 2017  -Hep B vaccine if <60, or >60 if diabetic   -Pneumococcus:Up to date, had 2016    RTC 6 months or sooner if condition worsens    [name], M.D, Ph.D  PGY2, Internal Medicine

## 2018-05-23 NOTE — Assessment & Plan Note (Signed)
Seen by Neurology, who are titrating off her gabapentin but do not think she can come off the [place] as there is no period in which she can stop driving her car.  - continue keppra  - Gabapentin may have been started as a medicine to help with alcohol use disorder, but given she has been in remission for all this time no objection to titrating it off.

## 2018-05-24 LAB — UA MICROSCOPY WITH REFLEX TO C
RBCs, urine: 3 /HPF (ref <3)
Round Epith Cells: >10 /LPF
Squam Epith Cells: >10 /LPF
WBCs, UR: 21 /HPF (ref <5)

## 2018-05-24 LAB — PROTHROMBIN TIME
Int'l Normaliz Ratio: 1 (ref 0.9–1.2)
PT: 13 s (ref 11.8–14.8)

## 2018-05-24 LAB — URINALYSIS WITH REFLEX TO CULT
Bilirubin, Urine: NEGATIVE
Glucose, (UA): NEGATIVE mg/dL
Hemoglobin (UA): NEGATIVE
Ketones, UA: NEGATIVE mg/dL
Nitrite: NEGATIVE
Protein, UA: NEGATIVE mg/dL
Specific Gravity: 1.024 (ref 1.002–1.030)
Urobilinogen: NEGATIVE mg/dL(EU/dL)
WBC Esterase: POSITIVE — AB
pH, UA: 5 (ref 4.5–8.0)

## 2018-05-24 LAB — B-TYPE NATRIURETIC PEPTIDE: BNP: 31 pg/mL (ref <96)

## 2018-05-24 LAB — THYROID STIMULATING HORMONE: Thyroid Stimulating Hormone: 2.84 mIU/L (ref 0.45–4.12)

## 2018-05-25 LAB — URINE CULTURE FROM SCREENING U: Urine culture (UCxR): >100000 — AB

## 2018-06-01 MED ORDER — LEVETIRACETAM 250 MG TABLET
250 | ORAL | 4 refills | Status: AC
Start: 2018-06-01 — End: ?

## 2018-06-28 ENCOUNTER — Ambulatory Visit: Admit: 2018-06-28 | Discharge: 2018-06-29 | Payer: MEDICARE | Attending: Diagnostic Radiology

## 2018-06-28 DIAGNOSIS — K7460 Unspecified cirrhosis of liver: Secondary | ICD-10-CM

## 2018-07-21 MED ORDER — METOPROLOL TARTRATE 50 MG TABLET: 50 mg | ORAL | 3 refills | Status: AC

## 2018-07-24 NOTE — Telephone Encounter (Signed)
Attempted to call patient to reschedule appointment, no answer, and voicemail box is full so unable to left message

## 2018-07-24 NOTE — Telephone Encounter (Signed)
Pt no showed to appointment. Called to check in and unable to reach. Left voicemail and asked admin to call to reschedule appt.

## 2018-07-24 NOTE — Progress Notes (Deleted)
No chief complaint on file.      Subjective / HPI       Aimee Smith is a 72 y.o. yo female who presents  For follow up    Last visit with me: 05/23/2018   Interval history: got abdominal US, no acute findings     #Needs endoscopy    #Needs mammogram     Meds:     Current Outpatient Medications on File Prior to Visit   Medication Sig Dispense Refill    gabapentin (NEURONTIN) 300 mg capsule TAKE 1 CAPSULE BY MOUTH 3 TIMES DAILY AND 2 CAPS AT BEDTIME (Patient taking differently: No sig reported) 180 capsule 11    levETIRAcetam (KEPPRA) 250 mg tablet TAKE 0.5 TABLET (HALF) BY MOUTH TWICE A DAY. 90 tablet 4    melatonin 10 mg TAB Take 10 mg by mouth nightly at bedtime.      metoprolol tartrate (LOPRESSOR) 50 mg tablet 1 TABLET IN THE MORNING, 1/2 TABLET AT NIGHT 135 tablet 0     No current facility-administered medications on file prior to visit.            ROS:   ROS          Exam / Objective Data    There were no vitals taken for this visit.      Physical Exam     COMPLETE ABDOMINAL SONOGRAM (RADIOLOGY PERFORMED):  06/28/2018 2:15 PM    CLINICAL HISTORY:    Cirrhosis.    COMPARISON: Sonograms dated 05/25/2016 and previous. CT scan dated 12/25/2013.    TECHNIQUE: Multiple high resolution transverse and longitudinal sonographic images of the abdomen were obtained.    FINDINGS:   Liver: Measures 14 cm in span. Mild increased hepatic echogenicity, suggesting fatty infiltration, and mild nodularity compatible with cirrhosis. 10 mm cyst within the left hepatic lobe. No focal solid liver lesion noted.  Spleen: Mildly enlarged. Measures 13 cm in span.  Gallbladder: Echogenic mobile intraluminal foci again seen indicating cholelithiasis. No focal gallbladder tenderness noted.  Common bile duct: Mildly dilated. Measures up to 9 mm in diameter, similar compared with prior sonogram. No intrahepatic biliary ductal dilatation noted.  Kidneys: Renal lengths measure 10 cm on each side. No hydronephrosis or renal calculus  noted. Within the right kidney, two small cysts are seen measuring up to 11 mm in diameter.  Pancreas: Where seen, appears within normal limits. The pancreas is incompletely visualized due to in part to overlying bowel.  Aorta: Where seen, appears normal in caliber, without evidence of aneurysmal dilatation. Incidental note made of calcific atherosclerotic plaque within the abdominal aorta.  Inferior vena cava: Where seen, appears within normal limits.   Main portal vein:  Measures 12 mm in diameter. Patent with hepatopetal flow.  No free intraperitoneal fluid seen.    IMPRESSION:     No significant interval change compared with prior sonogram.  The liver again demonstrates mild increased echogenicity compatible with fatty infiltration and nodular contour compatible with known cirrhosis. No focal solid liver lesion seen.    Mild splenomegaly again seen. No free intraperitoneal fluid noted.    Cholelithiasis again shown within the nontender gallbladder.   The common bile duct is mildly dilated and measures 9 mm in diameter, similar compared with prior sonogram.    Assessment and Plan:     Aimee Smith is a 72 y.o. 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  Today  MELD score is 6..   Hepatocellular carcinoma surveillance: The patient should undergo alternating abdominal ultrasound with dopplers, CT of the abdomen and pelvis with IV contrast, or MRI, and have a serum alfa-fetoprotein checked every 6 months to provide surveillance for the development of hepatocellular cancer.   -AFP last normal 2019, due Jan 2020.   -Abd ultrasound nl 05/2018, CT due  11/2018   Portal hypertension with risk of esophageal varices: has NOT had variceal bleeding.   -Last endoscopy was in 2015    -Due for endoscopy, will address at next visit (prioritizing abd ultrasound).   Fluid retention: The patient should adhere to a 2 gram sodium diet.   Hepatic encephalopathy:  no evidence today, no hx.   Healthcare maintenance:   The patient should be vaccinated against hepatitis A and B if she is not already immune. DXA scan should be performed, given the risk of osteodystrophy/osteoporosis in patients with chronic liver disease. Vitamin D levels should be checked annually.    - vitamin D to be obtained at next visit   -DEXA done 2016  Nutritional Assessment: Patient appears well developed and well nourished. No need for a dietary consult at this time.   Pharmacy Assessment: Current medications reviewed and discussed with the patient. No need for a pharmacy consult at this time.      #Leg edema  Pt describing ongoing history of leg edema, concerned for cardiac cause. Unlikely given no evidence of systemic overload (no JVD, no SOB) and that pts symptoms are very positional and related to time of day.  TSH and BNP are normal as of October 2019.   - obtain TSH and BNP to r/u thyroid or cardiac  - continue to encourage pt to use compression stockings    #Epilepsy.  Seen by Neurology, who are titrating off her gabapentin but do not think she can come off the [place] as there is no period in which she can stop driving her car.  - continue keppra  - Gabapentin may have been started as a medicine to help with alcohol use disorder, but given she has been in remission for all this time no objection to titrating it off.     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 She was seen and diagnosed in 2013 with SOB and cough. A clear cause was not known and it was thought that this might be related to aspiration; and that she might possibly have achalasia. She udnerwent dilation (per Pulm  note) which was useful.Currently she is asymptomatic with no shortness of breath or cough and normal pulmonary exam.  - CTM, no indication for Pulm referral today     #Varicose veins  Has varicose veins that bother her. Vascular referral already in.  - encouraged pt to f/u with Vascular    #Pain in multiple joints, intermittent difficulty with ambulating  Has pain in multiple joints. Also occasional balance issues. Given age would benefit from fall prevention and balance counseling as well as workin with her regarding her joint pain. No abnormalities on ambulation today.  - Referred to physical therapypreviously.    #Moderate alcohol use disorder, in sustained remission  Remains abstinent  - titrating off gabapentin as  above    #Patch on nose  Per Derm likely SK, NTD.    #Health Maintenance  Cancer Screening  -Colonoscopy every 10 years or FIT yearly:negative fit in 2019, next 2020  -Pap every 3 years (or every 5 years if co-tested with HPV and negative once >30):Not due secondary to age  -low-dose CT if age between 57-74 and 38 pack year history: had in 2015  -Breast: mammography every 2 years starting age 20 up to age 59:last in 2017, due, ordered, reminded pt today    General  -DEXA age >78 women: uptodate from 24,normal    Cardiovascular  -AAA: [place] if age 37-75 female and current or former smoker: NA    Vaccines  -annual flu vaccine: ordered today  -Tdap 1x with Td booster every 10 years: had 2011, due 2021   -Zoster for patients > 50: had 2017  -Hep B vaccine if <60, or >60 if diabetic   -Pneumococcus:Up to date, had 2016    RTC 6 months or sooner if condition worsens    [name], M.D, Ph.D  PGY2, Internal Medicine    [name], M.D, Ph.D  PGY2, Internal Medicine

## 2018-07-25 ENCOUNTER — Ambulatory Visit: Admit: 2018-07-25 | Discharge: 2018-07-25 | Payer: MEDICARE

## 2018-07-25 DIAGNOSIS — M1990 Unspecified osteoarthritis, unspecified site: Secondary | ICD-10-CM

## 2018-07-25 DIAGNOSIS — M19072 Primary osteoarthritis, left ankle and foot: Secondary | ICD-10-CM

## 2018-07-25 DIAGNOSIS — F1021 Alcohol dependence, in remission: Secondary | ICD-10-CM

## 2018-07-25 DIAGNOSIS — M7061 Trochanteric bursitis, right hip: Secondary | ICD-10-CM

## 2018-07-25 MED ORDER — VARICELLA-ZOSTER GLYCOE VACC-AS01B ADJ(PF) 50 MCG/0.5 ML IM SUSP, KIT
50 | Freq: Once | INTRAMUSCULAR | 0 refills | Status: AC
Start: 2018-07-25 — End: 2018-07-25

## 2018-07-25 MED ORDER — GABAPENTIN 300 MG CAPSULE
300 | ORAL_CAPSULE | Freq: Every day | ORAL | 11 refills | Status: AC
Start: 2018-07-25 — End: ?

## 2018-07-25 NOTE — Progress Notes (Signed)
Subjective:  Aimee Smith is an 72 y.o. female who presents to clinic for right knee and hip pain. Not very active recently. Notes pain right greater trochanter and medial right hip, especially with walking. Also pain right knee generalized with some swelling, limits mobility.   Left ankle has been sore for over a year, started with inverting ankle while driving and has hurt since.  Also has severe varicosities with some ankle edema. Better since vein surgery several years ago.  History of cirrhosis - abstaining from etoh  Only on one gabapentin at bedtime now   Continues on keppra and metoprolol  No injuries or trauma        Patient's allergies, and past medical histories were reviewed and updated as appropriate    Medications the patient states to be taking prior to today's encounter.   Medication Sig    gabapentin (NEURONTIN) 300 mg capsule TAKE 1 CAPSULE BY MOUTH 3 TIMES DAILY AND 2 CAPS AT BEDTIME (Patient taking differently: No sig reported)    levETIRAcetam (KEPPRA) 250 mg tablet TAKE 0.5 TABLET (HALF) BY MOUTH TWICE A DAY.    melatonin 10 mg TAB Take 10 mg by mouth nightly at bedtime.    metoprolol tartrate (LOPRESSOR) 50 mg tablet 1 TABLET IN THE MORNING, 1/2 TABLET AT NIGHT       Review of Systems  Pertinent review of symptoms are noted in the history of the present illness.    Objective:  BP 106/42 (BP Location: Left upper arm, Patient Position: Sitting, Cuff Size: Adult)   Pulse 60   Temp 36.4 C (97.5 F)   Wt 71.7 kg (158 lb)   SpO2 98%   BMI 27.99 kg/m   Constitutional: alert, healthy-appearing patient in no acute distress.  Head: Normocephalic, no lesions, without obvious abnormality.  Abd:  soft, non-tender; bowel sounds normal; no masses,  no organomegaly  Right hip tender over right greater troch, full range of motion without pain  Right knee some crepitus with patellar traction, full range of motion no laxity, no effusion  Left ankle slightly limited range of motion with pain with inversion,  no swelling  normal mood, behavior, speech, dress, and thought processes    Assessment/plan:  1. Osteoarthritis, unspecified osteoarthritis type, unspecified site  XR Knee 1 or 2 Views, Right    XR Hip with Pelvis, when performed, 2 - 3 Views, Right    XR Ankle 3 Views, Left    Sedimentation Rate    Complete Blood Count (includes Platelet Count)    Ambulatory referral to Dermatology   2. Moderate alcohol use disorder, in sustained remission (HCC)  gabapentin (NEURONTIN) 300 mg capsule   3. Trochanteric bursitis of right hip     4. Osteoarthritis of left ankle, unspecified osteoarthritis type     offered inj right greater troch bursa but she elects to defer pending xrays  Caution with nsaids due to cirrhosis and chronic leg edema        Health Maintenance   Topic Date Due    Zoster Vaccines [place] (1 of 2) 12/30/2015    Breast Cancer Screening  04/08/2018    [place] Screening (Cirrhosis)  12/28/2018    Depression Screening (Annual)  03/14/2019    Colorectal Screening FIT Annual  03/18/2019    Tobacco Screening  05/23/2020    Td Immunization  06/09/2020    Tdap Immunization  Completed    PNEUMOCOCCAL  Completed    Dexa Osteoporosis Scan or Monitor  Completed  Influenza Vaccines  Completed    Hepatitis C Routine Screening  Completed       RTC if symptoms worsen or persist, or for any other concerning symptoms.    Fredna Dow, MD

## 2018-07-25 NOTE — Patient Instructions (Signed)
Do xray and labs when convenient      Aimee Smith  [address], [address], [place] [address]  [phone] - Phone  Hours: Monday-Friday 8:30am-4:30pm

## 2018-08-10 ENCOUNTER — Ambulatory Visit: Admit: 2018-08-10 | Discharge: 2018-08-10 | Payer: MEDICARE

## 2018-08-10 DIAGNOSIS — M1990 Unspecified osteoarthritis, unspecified site: Secondary | ICD-10-CM

## 2018-09-18 DIAGNOSIS — R1032 Left lower quadrant pain: Secondary | ICD-10-CM

## 2018-09-18 DIAGNOSIS — N10 Acute tubulo-interstitial nephritis: Secondary | ICD-10-CM

## 2018-09-18 DIAGNOSIS — R109 Unspecified abdominal pain: Secondary | ICD-10-CM

## 2018-09-18 DIAGNOSIS — K7460 Unspecified cirrhosis of liver: Secondary | ICD-10-CM

## 2018-09-18 LAB — COMPREHENSIVE METABOLIC PANEL
AST: 26 U/L (ref 17–42)
Alanine transaminase: 17 U/L (ref 11–50)
Albumin, Serum / Plasma: 4.1 g/dL (ref 3.5–4.8)
Alkaline Phosphatase: 52 U/L (ref 31–95)
Anion Gap: 8 (ref 4–14)
Bilirubin, Total: 0.7 mg/dL (ref 0.2–1.3)
Calcium, total, Serum / Plasma: 9.4 mg/dL (ref 8.8–10.3)
Carbon Dioxide, Total: 24 mmol/L (ref 22–32)
Chloride, Serum / Plasma: 104 mmol/L (ref 97–108)
Creatinine: 0.66 mg/dL (ref 0.44–1.00)
Glucose, non-fasting: 92 mg/dL (ref 70–199)
Potassium, Serum / Plasma: 4.1 mmol/L (ref 3.5–5.1)
Protein, Total, Serum / Plasma: 7.4 g/dL (ref 6.0–8.4)
Sodium, Serum / Plasma: 136 mmol/L (ref 135–145)
Urea Nitrogen, Serum / Plasma: 13 mg/dL (ref 6–22)
eGFR - high estimate: 102 mL/min
eGFR - low estimate: 88 mL/min

## 2018-09-18 LAB — COMPLETE BLOOD COUNT WITH DIFF
Abs Basophils: 0.02 10*9/L (ref 0.0–0.1)
Abs Eosinophils: 0.05 10*9/L (ref 0.0–0.4)
Abs Imm Granulocytes: 0.01 10*9/L (ref <0.1)
Abs Lymphocytes: 1.44 10*9/L (ref 1.0–3.4)
Abs Monocytes: 0.34 10*9/L (ref 0.2–0.8)
Abs Neutrophils: 2.78 10*9/L (ref 1.8–6.8)
Hematocrit: 39.9 % (ref 36–46)
Hemoglobin: 13.2 g/dL (ref 12.0–15.5)
MCH: 30.4 pg (ref 26–34)
MCHC: 33.1 g/dL (ref 31–36)
MCV: 92 fL (ref 80–100)
Platelet Count: 108 10*9/L — ABNORMAL LOW (ref 140–450)
RBC Count: 4.34 10*12/L (ref 4.0–5.2)
WBC Count: 4.6 10*9/L (ref 3.4–10)

## 2018-09-18 LAB — POCT MULTISTIX 10SG URINALYSIS
POCT Bilirubin, UA: NEGATIVE
POCT Glucose, UA: NEGATIVE
POCT Nitrite, UA: NEGATIVE
POCT Spec Grav, UA: 1.03 (ref 1.002–1.030)
POCT pH, UA: 6 (ref 4.5–8.0)
Test Strip(s) Lot Number: 904071
Urobilinogen, UA: 0.2 (ref ?–1)

## 2018-09-18 MED ORDER — CEFIXIME 400 MG CAPSULE
400 | ORAL_CAPSULE | Freq: Every day | ORAL | 0 refills | Status: DC
Start: 2018-09-18 — End: 2018-09-24

## 2018-09-18 MED ORDER — MECLIZINE 25 MG TABLET
25 | ORAL | Status: AC | PRN
Start: 2018-09-18 — End: ?

## 2018-09-18 NOTE — Progress Notes (Signed)
[facility]        Chief complaint:   Chief Complaint   Patient presents with    Abdominal Pain     yesterday to lLQ w/nausea/ sweaty palms. Pt notes slight burning during urination yesterday. LBM this morning         History of present illness:   Aimee Smith is a 73 y.o. female presents with abdominal pain    Abdominal pain,   -LLQ: she was out yesterday around noon and while in the car noticed sudden onset "excrutiating pain" in LLQ, 10/10 that lasted  for 30 min, non radiating. States she didn't walk at the time but denies postural or movement associated changes later  -didn't wake her up   -felt she needed to have a BM, couldn't go home since it was "being shown"  and went to [facility]: at the same time she felt nauseated and her palms got clammy (for duration of pain) .  Didn't need to pass stool then , and had no increased gas  -states almost called 911, never has similar pain in that area.    -decided to take Gas X , which made the pain felt much better (didn't pass gas, nor stool before improvement) . Pain went down to 4 by 8 PM and was able to eat scrambled eggs and toast . Still felt tired, no recurrence of nausea, no vomiting, no systemic symptoms .   -->This AM had tea but the area felt much better   -c/o slight dysuria (end of urination, no increased frequency, since yesterday   -LBM: this AM formed, brown , no BRBPR. No melena  -Denies paresthesias, weakness , bladder or bowel changes. Denies systemic symptoms, no fevers. 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    levETIRAcetam (KEPPRA) 250 mg tablet TAKE 0.5 TABLET (HALF) BY MOUTH TWICE A DAY.    metoprolol tartrate (LOPRESSOR) 50 mg tablet 1 TABLET IN THE MORNING, 1/2 TABLET AT NIGHT    meclizine (ANTIVERT) 25 mg tablet Take 25 mg by mouth daily as needed.    melatonin 10 mg TAB Take 10 mg by mouth nightly at bedtime.               Patient's allergies, medications, past medical, surgical, family, social history were updated and reviewed as appropriate.     Review of Systems:   Level 2 EST  = 0; Level 3 EST = 1; Level 4 EST = 2; Level 5 EST =  10   General: no fevers, malaise, abnormal weight changes, night sweats  Psych:   HEENT: no visual changes, no nasal congestion/discharge, sore throat, hearing changes  Pulm: no cough or dyspnea  Cards: no chest pain, palpitations, pedal edema  GI: no vomiting, appetite changes,  bowel habit changes. No melena or BRBPR  GU:  no  discharge  MSK:  no joint pain or swelling  Derm: no rash   Neuro:  no localized weakness, paresthesias      Physical Exam:   Level 2 EST = 1, Level 3 EST = 2; Level 4 EST = 2 w/ detail;  Level 5 EST = 8  BP 133/57 (BP Location: Right upper arm, Patient Position: Sitting, Cuff Size: Small Adult)   Pulse 55   Temp 36.6 C (97.8 F) (Oral)   Resp 16   SpO2 100%       General appearance: Well developed, no acute distress  HEENT:  EOMI, Normal conjunctiva, sclera anicteric. OP: clear  Neck: No thyromegaly, no LAD, no masses  CV: RRR, no rub / gallop   Respiratory:  Clear to auscultation bilaterally, normal respiratory effort  Abdomen: soft, mildly tender suprapubic area, normal BS, no HSM. No CVAT  Skin:  No rash,    Psych:   Normal mood and affect,  normal judgement and insight      Lab results and other data reviewed and/or performed in clinic:  Component      Latest Ref Rng & Units 03/13/2018 05/23/2018   Albumin, Serum / Plasma      3.5 - 4.8 g/dL 4.0    Alkaline Phosphatase      31 - 95 U/L 58    ALT      11 - 50 U/L 21    Aspartate transaminase      17 - 42 U/L 36    Bilirubin, Total      0.2 - 1.3 mg/dL 0.6    Urea Nitrogen, Serum / Plasma      6 - 22 mg/dL 9    Calcium      8.8 - 10.3 mg/dL 9.4    Chloride      97 - 108 mmol/L 105    Creatinine      0.44 - 1.00 mg/dL 0.67    eGFR if non-African American      mL/min 88    eGFR if African Amer      mL/min 102    Potassium      3.5 - 5.1 mmol/L 4.3    Sodium      135 - 145 mmol/L 139    Protein, Total, Serum / Plasma      6.0 - 8.4 g/dL 7.5    CO2      22 - 32 mmol/L 27    Anion Gap      4 - 14 7    Glucose      70 - 199 mg/dL 103    WBC Count      3.4 - 10 x10E9/L 3.4    RBC Count      4.0 - 5.2 x10E12/L 4.50    HEMOGLOBIN (HGB)      12.0 - 15.5 g/dL 13.7    Hematocrit      36 - 46 % 41.2    MCV      80 - 100 fL 92    MCH      26 - 34 pg 30.4  MCHC      31 - 36 g/dL 33.3    Platelet Count      140 - 450 x10E9/L 93 (L)    PT      11.8 - 14.8 s  13.0   Int'l Normaliz Ratio      0.9 - 1.2  1.0   Hemoglobin A1c      4.3 - 5.6 PERCENT 5.7 (H)    Alpha-Fetoprotein, serum      <8.9 ug/L 4.8    Thyroid Stimulating Hormone      0.45 - 4.12 mIU/L  2.84   B-Type Natriuretic Peptide      <96 pg/mL  31         COMPLETE ABDOMINAL SONOGRAM (RADIOLOGY PERFORMED):  06/28/2018 2:15 PM    CLINICAL HISTORY:    Cirrhosis.    COMPARISON: Sonograms dated 05/25/2016 and previous. CT scan dated 12/25/2013.    TECHNIQUE: Multiple high resolution transverse and longitudinal sonographic images of the abdomen were obtained.    FINDINGS:   Liver: Measures 14 cm in span. Mild increased hepatic echogenicity, suggesting fatty infiltration, and mild nodularity compatible with cirrhosis. 10 mm cyst within the left hepatic lobe. No focal  solid liver lesion noted.  Spleen: Mildly enlarged. Measures 13 cm in span.  Gallbladder: Echogenic mobile intraluminal foci again seen indicating cholelithiasis. No focal gallbladder tenderness noted.  Common bile duct: Mildly dilated. Measures up to 9 mm in diameter, similar compared with prior sonogram. No intrahepatic biliary ductal dilatation noted.  Kidneys: Renal lengths measure 10 cm on each side. No hydronephrosis or renal calculus noted. Within the right kidney, two small cysts are seen measuring up to 11 mm in diameter.  Pancreas: Where seen, appears within normal limits. The pancreas is incompletely visualized due to in part to overlying bowel.  Aorta: Where seen, appears normal in caliber, without evidence of aneurysmal dilatation. Incidental note made of calcific atherosclerotic plaque within the abdominal aorta.  Inferior vena cava: Where seen, appears within normal limits.   Main portal vein:  Measures 12 mm in diameter. Patent with hepatopetal flow.  No free intraperitoneal fluid seen.    IMPRESSION:     No significant interval change compared with prior sonogram.  The liver again demonstrates mild increased echogenicity compatible with fatty infiltration and nodular contour compatible with known cirrhosis. No focal solid liver lesion seen.    Mild splenomegaly again seen. No free intraperitoneal fluid noted.    Cholelithiasis again shown within the nontender gallbladder.     The common bile duct is mildly dilated and measures 9 mm in diameter, similar compared with prior sonogram.    Report dictated by: Clelia Schaumann, MD, signed by: Clelia Schaumann, MD  Department of Radiology and Biomedical Imaging    Lab Results   Component Value Date    POCT Leukocytes, UA Moderate (2+) (A) 09/18/2018    POCT Nitrite, UA Negative 09/18/2018    POCT Protein, UA Trace (A) 09/18/2018    POCT pH, UA 6.0 09/18/2018    POCT Blood, UA Trace - hemolyzed (A) 09/18/2018    POCT Spec Grav, UA 1.030  09/18/2018    POCT Ketones, UA Trace (5 mg/dL) (A) 09/18/2018    POCT Bilirubin, UA Negative 09/18/2018    POCT Glucose, UA Negative 09/18/2018       I personally reviewed AND INTERPRETED the following test(s):     Assessment and Plan:     LLQ pain, new  No hx of  same, severe and new in patient with extensive medical hx  Ddx:   Kidney stone vs pyelo/UTI, vs intestinal (doubt obstruction, diverticular/gas (denies hx of similar) partial obstruction , pancreas/GB (atypical)  Currently much improved  POCT UA: positive for leukocytes   Plan:  -sent out CBC, chems/lipase/ UA -Ucx  -f/u post results or PRN worse: consider imaging /ED  -discussed diet in detail/bowel rest and hydration   -gave patient instructions  -gave return precautions     Hx of cirrhosis, new to me  And no evidence of decompensation  Pending AFP based on PCP's notes: added to labs today  -f/u with PCP   I advised patient to f/u with PCP  I provided patient with printed AVS             If you requested or reviewed records during this encounter, please detail:     I spent a total of ___ minutes face-to-face with the patient and ___ minutes of that time was spent counseling regarding _______

## 2018-09-18 NOTE — Progress Notes (Signed)
Verified name/DOB/lab orders. Pt tol BL AC attempts, successful on left AC lab draw, dressings applied awaiting 2pm courier pickup.

## 2018-09-19 ENCOUNTER — Ambulatory Visit: Admit: 2018-09-19 | Discharge: 2018-09-19 | Payer: MEDICARE

## 2018-09-19 LAB — URINALYSIS WITH REFLEX TO CULT
Bilirubin, Urine: NEGATIVE
Glucose, (UA): NEGATIVE mg/dL
Hemoglobin (UA): NEGATIVE
Ketones, UA: 5 mg/dL — AB
Nitrite: NEGATIVE
Protein, UA: NEGATIVE mg/dL
Specific Gravity: 1.023 (ref 1.002–1.030)
Urobilinogen: NEGATIVE mg/dL(EU/dL)
WBC Esterase: POSITIVE — AB
pH, UA: 5 (ref 4.5–8.0)

## 2018-09-19 LAB — UA MICROSCOPY WITH REFLEX TO C
RBCs, urine: <3 /HPF (ref <3)
Round Epith Cells: 0 /LPF
Squam Epith Cells: >10 /LPF
WBCs, UR: 11 /HPF (ref <5)

## 2018-09-19 LAB — LIPASE: Lipase: 37 U/L (ref 19–56)

## 2018-09-20 LAB — ALPHA-FETOPROTEIN, SERUM: Alpha-Fetoprotein, serum: 4.4 ug/L (ref <8.9)

## 2018-09-20 LAB — URINE CULTURE FROM SCREENING U: Urine culture (UCxR): 10000 — AB

## 2018-09-21 NOTE — Telephone Encounter (Signed)
-----   Message from Isaac Bliss, MD sent at 09/21/2018  7:30 AM PST -----    ----- Message -----  From: Clarice Pole, RN  Sent: 09/20/2018   1:38 PM PST  To: Isaac Bliss, MD      ----- Message -----  From: Labs, Clinical  Sent: 09/19/2018   2:50 PM PST  To: [place] Staff

## 2018-09-21 NOTE — Telephone Encounter (Signed)
RN: Pls assess if pt has any persistent or new urinary symptoms that would prompt [place] to recheck UA with reflex culture.

## 2018-09-21 NOTE — Telephone Encounter (Signed)
Spoke with patient who is currently taking an antibiotic (cefixime x 7 days) for her mild dysuria.  Says sxs have not worsened and thinks there is some improvement.  In regards to the LLQ abd pain, this has resolved though she will have fleeting sensations of tenderness in the area.   She tells me she is refraining from nuts and seeds out of concern for diverticulosis.  Has had a firmer and smaller BM, passing gas, appetite good.  She will be moving to [place] in 1 month.  She does plan to come back to the [facility] and would like to be seen at [place] at those times.  We discussed seeing her PCP one last time before she leaves just to wrap up care.  Booked her to see Dr. Harrington Challenger on 2/18 at [place].  Pt wishes to discuss chronic hip pain and ortho referral, chronic sharp shooting foot pain and FU on LLQ pain.  Pt verbalized appreciation for the check in call.    Dr. Harrington Challenger  FYI only.  Pt will see you before she moves to [place]

## 2018-09-24 MED ORDER — CEFIXIME 400 MG CAPSULE
400 | ORAL_CAPSULE | Freq: Every day | ORAL | 0 refills | Status: AC
Start: 2018-09-24 — End: ?

## 2018-09-24 NOTE — Telephone Encounter (Signed)
On call message  09/24/18  1:00 PM    Call from patient re: possible abx reaction.  Seen at urgent care last Monday with LLQ pain, dx'd with UTI and placed on cefixime 400 mg daily x 7 days, to take last dose tonight.  On Thursday, thought had been bitten by something on her thigh as she developed a red area with a pimple there.  Went to urgent care in [place], diagnosed with folliculitis, and given prescription for sulfamethoxazole. Went to [place] on Friday. While there, started sulfa abx. About an hour after first dose, had episode of severe shaking, sweating, difficulty walking, felt "out of it".  Went to ED. Per her report, workup including labs were normal and she was thought to be dehydrated. Improved in ED with no significant intervention.. Does note that she had not eaten much prior to taking abx dose. Was advised to continue both abx so she took another dose of SMX last night with no ill effects.    Currently abdominal pain has resolved but area on thigh is still red with a "volcano" lesion in the middle.  No drainage. No fevers or chills.  Has f/u appointment with PCP on Tuesday.    A/P:  Potential reaction to sulfa drug. Hypoglycemia is a rare but known side effect of TMP-SMX, and symptoms could have been hypoglycemia or other side effect of medication.  Unlikely to be reaction to cefixime, as she had taken this medication for almost 1 week before the episode.  Since area on thigh still sounds like there may be infection present, will extend cefixime course for 3 days for total of 10 days and stop TMP-SMX. Unless MRSA, cefixime should be adequate coverage for SSTI. Patient has f/u appointment with PCP on Tuesday for re-evaluation.  eRx for cefixime sent to pharmacy.

## 2018-09-26 DIAGNOSIS — G40909 Epilepsy, unspecified, not intractable, without status epilepticus: Secondary | ICD-10-CM

## 2018-09-26 DIAGNOSIS — I471 Supraventricular tachycardia: Secondary | ICD-10-CM

## 2018-09-26 DIAGNOSIS — D696 Thrombocytopenia, unspecified: Secondary | ICD-10-CM

## 2018-09-26 DIAGNOSIS — M1990 Unspecified osteoarthritis, unspecified site: Secondary | ICD-10-CM

## 2018-09-26 DIAGNOSIS — J849 Interstitial pulmonary disease, unspecified: Secondary | ICD-10-CM

## 2018-09-26 DIAGNOSIS — F1021 Alcohol dependence, in remission: Secondary | ICD-10-CM

## 2018-09-26 DIAGNOSIS — K7460 Unspecified cirrhosis of liver: Secondary | ICD-10-CM

## 2018-09-26 DIAGNOSIS — Z8719 Personal history of other diseases of the digestive system: Secondary | ICD-10-CM

## 2018-09-26 NOTE — Progress Notes (Signed)
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      PCP: Me  Last seen 05/2018   Interval history:  - acute visit 07/2018 for knee pain, XRs of knee and hip and ankle all orderd also ESR and CBC  - R hip DJD noted, slight R knee effusion  - Abd US done 05/2018   - 09/18/2018  - episode of sudden severe abdominal pain that self resolved after taking an 'antigas pill'.  POCT UA was +for leukocytes, sent for CBC, chems, lipiase, and UA/UCx. The CBC and chemistry was essentially normal (see below). UA was positive for keteones and WBC esterase, she was started on cefixime for presumed UTI.  - Saw urgent care in [place], while on cefixime, for thigh lesion on the right that developed. Was prescribed bactrim in addition to cefixime.  - Flew to [place] for racetrack, there after taking second dose of bactrim had episode of shaking, near-syncope, weakness. Presentd to ED, was told labs were normal and that it might be due to dehydration. Called advice line and was advised to stop taking Bactrim.     Recent labs (all 09/2018)  CMP: normal  CBC: notable for plts 108  Lipase: 37  AFP 4.4   Urinanalysis notable for ketones, WBC esterase  Urin culture with mixed vaginal flora     #Dysuria  Not sure she ever had true dysuria or typical UTI symptoms. Currently no frequency, dysuria, or suprapubic pain. Still has 2 days of cefixime.     #LLQ abd pain   09/18/2018. Severe episode of abdominal pain (as above). Has not recurred. Was briefly associated with urge to have a BM (did not have BM). Has had occasional  tenderness in LLQ abdominal pain. Notes that this has never happened before. No fevers or chills., She did vomit x1 at the time but not since.     #Thigh lesion   Developed in the car, thinks she may have been bitten by something. Now improving. Marland Kitchen     #Possible antibiotic reaction  Felt shaky and unwell immediately after taking second dose of Bactrim while also taking cefixime. Unfortunately [place] hospital did not give her a copy of the labs but she believes they were all normal. SHe stopped taking Bactrim (kept taking cefixime) and problem has not recurred.     #Cirrhosis  AFP nl  Due for Vit D, endoscopy     #Hip DJD  Noted on X-ray. Wonders if there are definitive options available.     Meds:     Current Outpatient Medications on File Prior to Visit   Medication Sig Dispense Refill    ceFIXime 400 mg CAP Take 400 mg by mouth daily. 7 capsule 0    gabapentin (NEURONTIN) 300 mg capsule Take 1 capsule (300 mg total) by mouth nightly at bedtime. 180 capsule 11    levETIRAcetam (KEPPRA) 250 mg tablet TAKE 0.5 TABLET (HALF) BY MOUTH TWICE A DAY. 90 tablet 4    meclizine (ANTIVERT) 25 mg tablet Take 25 mg  by mouth daily as needed.      melatonin 10 mg TAB Take 10 mg by mouth nightly at bedtime.      metoprolol tartrate (LOPRESSOR) 50 mg tablet 1 TABLET IN THE MORNING, 1/2 TABLET AT NIGHT 135 tablet 3     No current facility-administered medications on file prior to visit.            ROS:   Review of Systems   Constitutional: Negative for chills and fever.   HENT: Negative.    Eyes: Negative.    Respiratory: Negative.    Cardiovascular: Negative.    Gastrointestinal: Positive for abdominal pain and vomiting. Negative for constipation, diarrhea and nausea.   Genitourinary: Negative for dysuria, frequency and urgency.   Musculoskeletal: Negative.    Skin: Negative.         Lesion     Neurological: Positive for weakness. Negative for seizures and loss of consciousness.   Endo/Heme/Allergies: Negative.     Psychiatric/Behavioral: Negative.              Exam / Objective Data    BP 146/49 (BP Location: Left upper arm, Patient Position: Sitting, Cuff Size: Adult)   Pulse 57   Wt 72 kg (158 lb 11.2 oz)   SpO2 100%   BMI 28.11 kg/m       Physical Exam  Constitutional:       General: She is not in acute distress.     Appearance: Normal appearance. She is not ill-appearing or toxic-appearing.   HENT:      Head: Normocephalic and atraumatic.      Nose: No congestion or rhinorrhea.   Eyes:      General: No scleral icterus.     Conjunctiva/sclera: Conjunctivae normal.   Neck:      Musculoskeletal: Normal range of motion and neck supple.   Cardiovascular:      Rate and Rhythm: Normal rate and regular rhythm.      Heart sounds: No murmur. No friction rub. No gallop.    Pulmonary:      Effort: Pulmonary effort is normal. No respiratory distress.      Breath sounds: Normal breath sounds. No wheezing.   Abdominal:      General: There is no distension.      Palpations: Abdomen is soft. There is no mass.      Tenderness: There is no tenderness. There is no right CVA tenderness, left CVA tenderness or rebound.      Comments: No tenderness, palpable masses or lesions   Musculoskeletal:         General: Tenderness present.      Right lower leg: No edema.      Left lower leg: No edema.      Comments: Right hip pain with walking   Skin:     General: Skin is warm and dry.      Comments: Scab on inside of right thigh, healing, no pus or fluctuance    Neurological:      General: No focal deficit present.      Mental Status: She is alert and oriented to person, place, and time.   Psychiatric:         Mood and Affect: Mood normal.         Behavior: Behavior normal.         Thought Content: Thought content normal.           XR KNEE 1 OR 2 VIEWS, RIGHT  08/10/2018 2:20 PM  CLINICAL HISTORY:    prob oa knee  COMPARISON:  None   IMPRESSION:   No evidence of acute fracture or dislocation. Joint spaces preserved. Soft tissues unremarkable. Mild  right knee effusion    XR ANKLE 3 VIEWS, LEFT   08/10/2018 2:20 PM  CLINICAL HISTORY:    l ankle pain for one year  COMPARISON:  None   IMPRESSION:   No evidence of acute fracture or dislocation. Joint spaces preserved. Very mild medial left ankle soft tissue swelling.    XR HIP WITH PELVIS, WHEN PERFORMED, 2 - 3 VIEWS, RIGHT   08/10/2018 1:46 P  CLINICAL HISTORY:  pain r hip laterally over greater troch and medially  COMPARISON: None  IMPRESSION:   Space narrowing involving the right hip joint particularly laterally, suggesting marked right hip DJD  No evidence of fracture.  Small round calcification projecting over the right gluteus maximus likely representing injection granuloma.      COMPLETE ABDOMINAL SONOGRAM (RADIOLOGY PERFORMED):  06/28/2018 2:15 PM  CLIICAL HISTORY:    Cirrhosis.  COMPARISON: Sonograms dated 05/25/2016 and previous. CT scan dated 12/25/2013.  TECHNIQUE: Multiple high resolution transverse and longitudinal sonographic images of the abdomen were obtained.    FINDINGS:   Liver: Measures 14 cm in span. Mild increased hepatic echogenicity, suggesting fatty infiltration, and mild nodularity compatible with cirrhosis. 10 mm cyst within the left hepatic lobe. No focal solid liver lesion noted.  Spleen: Mildly enlarged. Measures 13 cm in span.  Gallbladder: Echogenic mobile intraluminal foci again seen indicating cholelithiasis. No focal gallbladder tenderness noted.  Common bile duct: Mildly dilated. Measures up to 9 mm in diameter, similar compared with prior sonogram. No intrahepatic biliary ductal dilatation noted.  Kidneys: Renal lengths measure 10 cm on each side. No hydronephrosis or renal calculus noted. Within the right kidney, two small cysts are seen measuring up to 11 mm in diameter.  Pancreas: Where seen, appears within normal limits. The pancreas is incompletely visualized due to in part to overlying bowel.  Aorta: Where seen, appears normal in caliber, without evidence of aneurysmal  dilatation. Incidental note made of calcific atherosclerotic plaque within the abdominal aorta.  Inferior vena cava: Where seen, appears within normal limits.   Main portal vein:  Measures 12 mm in diameter. Patent with hepatopetal flow.  No free intraperitoneal fluid seen.  IMPRESSION:   No significant interval change compared with prior sonogram.  The liver again demonstrates mild increased echogenicity compatible with fatty infiltration and nodular contour compatible with known cirrhosis. No focal solid liver lesion seen.  Mild splenomegaly again seen. No free intraperitoneal fluid noted.  Cholelithiasis again shown within the nontender gallbladder.   The common bile duct is mildly dilated and measures 9 mm in diameter, similar compared with prior sonogram.  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     #LLQ pain  Improved. DDx includes diverticulitis, ovarian abnormalities (eg torsion), gas pain, less likely urinary.  - Appears to have improved on antibiotics, but if it should recur would consider imaging/ED  - If diverticulosis-related, would benefit from colonoscopy (is also due for screening colonoscopy and would like to switch from FIT tests to colonoscopies  - ordered colonoscopy in addition to  endoscopy that she needs for cirrhosis (see below)     #Thigh lesion  Improving, no pus or fluctuance. Suspect ingrown hair vs insect bite.  - advised pt to stop bactrim and finish out course of cefixime     #Possible antibiotic reaction  All symptoms stopped with cessation of Bactrim. This does not sound c/w true allergic reaction, but rather may indicate a brief episode of symptomatic hypoglycemia  vs dehydration in setting of taking multiple antibiotics.   - stop Bactrim  - return/ED discussed      #Hip DJD  X-ray with severe DJD on right hip. Interested in exploring further management for pain control and help with ambulation.  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  Most recent MELD score is 6..   Hepatocellular carcinoma surveillance: The patient should undergo alternating abdominal ultrasound with dopplers, CT of the abdomen and pelvis with IV contrast, or MRI, and have a serum alfa-fetoprotein checked every 6 months to provide surveillance for the development of hepatocellular cancer.   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   -Last endoscopy was in 2015    -Due for endoscopy, ordered   Fluid retention: The patient should adhere to a 2 gram sodium diet.   Hepatic encephalopathy:  no evidence today, no hx.   Healthcare maintenance:   The patient should be vaccinated against hepatitis A and B if she is not already immune. DXA scan should be performed, given the risk of osteodystrophy/osteoporosis in patients with chronic liver disease. Vitamin D levels should be checked annually.   - up to date on vaccines    - vitamin D to be obtained with next blood draw (needs annually)  -DEXA done 2016  Nutritional Assessment: Patient appears well developed and well nourished. No need for a dietary consult at this time.   Pharmacy Assessment: Current medications reviewed and discussed with the patient. No need for a pharmacy consult at this time.        Chronic problems - included for my reference only  #Epilepsy.  Seen by Neurology, who are titrating off her gabapentin but do not think she can come off the [place] as there is no  period in which she can stop driving her car.  - continue keppra    #ILD  Unclear diagnosis,no current symptoms   - CTM    #Varicose veins  - encouraged pt to f/u with Vascular    #Pain in multiple joints, intermittent difficulty with ambulating  Has pain in multiple joints. Also occasional balance issues. Given age would benefit from fall prevention and balance counseling as well as workin with her regarding her joint pain. No abnormalities on ambulation today.  - Referred to physical therapypreviously.    #Moderate alcohol use disorder, in sustained remission  Remains abstinent  - titrating off gabapentin as above    #Health Maintenance  Cancer Screening  -Colonoscopy every 10 years or FIT yearly:negative fit in 2019, would like to switch to colonoscopies, ordered   -Pap every 3 years (or every 5 years if co-tested with HPV and negative once >30):Not due secondary to age  -low-dose CT if age between 38-74 and 30 pack year history: had in 2015  -Breast: mammography every  2 years starting age 4 up to age 58:last in 2017, due, ordered, reminded pt today    General  -DEXA age >73 women: uptodate from 19,normal    Cardiovascular  -AAA: [place] if age 79-75 female and current or former smoker: NA    Vaccines  -annual flu vaccine: up to date   -Tdap 1x with Td booster every 10 years: had 2011, due 2021   -Zoster for patients > 50: had 2017  -Hep B vaccine if <60, or >60 if diabetic   -Pneumococcus:Up to date, had 2016    RTC 6 months or PRN  To do:  Mammogram, colonoscopy/endoscopy, q6 month labs (at next visit), and CT of abdomen at next visit    [name], M.D, Ph.D  PGY2, Internal Medicine

## 2018-09-26 NOTE — Patient Instructions (Addendum)
It was a pleasure to see you in clinic today. Here are some things we discussed for you to do:   - finish out the antibiotics  - please follow up for your mammogram   - please get the endoscopy/colonoscopy   - we will check your vitamin D at next visit  - please come back and see me in about 6 months  - we will refer you to orthopedics for evaluation of your hip.

## 2018-09-27 ENCOUNTER — Ambulatory Visit: Admit: 2018-09-27 | Discharge: 2018-09-27 | Payer: MEDICARE

## 2018-09-27 DIAGNOSIS — R1032 Left lower quadrant pain: Secondary | ICD-10-CM

## 2018-09-27 DIAGNOSIS — T3695XA Adverse effect of unspecified systemic antibiotic, initial encounter: Secondary | ICD-10-CM

## 2018-09-27 NOTE — Assessment & Plan Note (Signed)
All symptoms stopped with cessation of Bactrim. This does not sound c/w true allergic reaction, but rather may indicate a brief episode of symptomatic hypoglycemia vs dehydration in setting of taking multiple antibiotics.   - stop Bactrim  - return/ED discusse

## 2018-09-27 NOTE — Assessment & Plan Note (Signed)
Improved. DDx includes diverticulitis, ovarian abnormalities (eg torsion), gas pain, less likely urinary.  - Appears to have improved on antibiotics, but if it should recur would consider imaging/ED  - If diverticulosis-related, would benefit from colonoscopy (is also due for screening colonoscopy and would like to switch from FIT tests to colonoscopies  - ordered colonoscopy in addition to endoscopy that she needs for cirrhosis (see below)

## 2018-10-03 DIAGNOSIS — Z0000 Encounter for general adult medical examination without abnormal findings: Secondary | ICD-10-CM

## 2018-10-03 DIAGNOSIS — Z1231 Encounter for screening mammogram for malignant neoplasm of breast: Secondary | ICD-10-CM

## 2018-10-04 ENCOUNTER — Ambulatory Visit: Admit: 2018-10-04 | Discharge: 2018-10-04 | Payer: MEDICARE

## 2018-10-10 NOTE — Telephone Encounter (Signed)
Pt. calling for the following reason:    Asking if PCP can help expedite appt for colonoscopy/ endoscopy.   Pt scheduled for next available appt in May.   Requesting urgent referral to GI as pt will be moving to [place] and would like to have this done before move.  Best number to reach caller at: same  Patient's home phone number: [phone]       OTT Reason for Call: Referral

## 2018-10-12 NOTE — Telephone Encounter (Signed)
Aimee Smith,    Would you pls call GI clinic to see any soonest opening?  Is it necessary to change to URGENT referral?      Thank You.

## 2018-10-16 NOTE — Telephone Encounter (Addendum)
Called GI last week and this morning left message with the answering service asking for a sooner appt. Will try calling back later.

## 2018-10-16 NOTE — Telephone Encounter (Signed)
Spoke with pt let know GI has no sooner appointments. Pt will be place on the cancellation list.

## 2018-10-18 ENCOUNTER — Ambulatory Visit: Admit: 2018-10-18 | Discharge: 2018-10-18 | Payer: MEDICARE

## 2018-10-18 DIAGNOSIS — M25551 Pain in right hip: Secondary | ICD-10-CM

## 2018-10-18 DIAGNOSIS — M1611 Unilateral primary osteoarthritis, right hip: Secondary | ICD-10-CM

## 2018-10-18 NOTE — H&P (Signed)
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     CHIEF COMPLAINT: Right hip pain    HISTORY OF PRESENT ILLNESS:   Aimee Smith who is a 73 y.o. female who has a history ofright hip pain that has become progressively worse as of the past 6 month(s). PMH significant for interstitial lung disease, h/o of seizures, h/o of pancreatitis, and liver disease. Also reports left ankle issues, which might have contributed to the left hip. She reports she was on oral steroid for 1.5 years for the lung issues.     The patient localizes the pain to in the groin area.It is a dull, aching and sharp pain depending on their activities, 3/10 on VAS. Has no difficulty with shoes and socks. She has discomfort with stairs, prolonged standing and walking; walks with a limp. Overall, the problem has been getting progressively worse and is now significantly impacting quality of life.    Other pertinent details of the history include:  Anti-inflammatory medication history: None  Home exercise/activities:walking but now limited  Ambulatory capacity: less than 1 blocks  Assistive devices: not using a cane or other assistive device  Stair climbing: one foot at a time, with one hand support  Physical therapy:No   Weight gain/loss: has been stable  Bracing: No   Corticosteroid injections and/or viscosupplementation: None    Past Medical History:   Diagnosis Date    Achalasia     Arrhythmia     Depression     GERD (gastroesophageal reflux disease)     Headache     migraine with visual aura    Hypothyroidism 07/11/2015    ILD (interstitial lung disease) ([place])     Liver disease     Meniere's disease     last vertigo attack was 2014    Moderate alcohol use disorder, in early remission ([place]) 01/15/2014    Nonmelanoma skin cancer     Osteoarthritis of right hip     Pancreatitis     Sciatica of right side 03/20/2014    Seizures ([facility])     Thrombocytopenia ([facility])        Past  Surgical History:   Procedure Laterality Date    APPENDECTOMY      HYSTERECTOMY         FAMILY HISTORY:  Family History   Problem Relation Name Age of Onset    Hypertension Mother Kennyth Arnold     Osteoporosis Mother [facility]     Mitral valve prolapse Mother Kennyth Arnold     Cancer Father Jenny Reichmann     Skin ca. unk/oth Father John     Basal cell carcinoma Sister      Squamous cell carcinoma Neg Hx         Current Medications       Dosage    ceFIXime 400 mg CAP Take 400 mg by mouth daily.    gabapentin (NEURONTIN) 300 mg capsule Take 1 capsule (300 mg total) by mouth nightly at bedtime.    levETIRAcetam (KEPPRA) 250 mg tablet TAKE 0.5 TABLET (HALF) BY MOUTH TWICE A DAY.    meclizine (ANTIVERT) 25 mg tablet Take 25 mg by mouth daily as needed.    melatonin 10 mg TAB Take 10 mg by mouth nightly at bedtime.    metoprolol tartrate (LOPRESSOR) 50 mg tablet 1 TABLET IN THE MORNING, 1/2 TABLET AT NIGHT  ALLERGIES:  Allergies/Contraindications   Allergen Reactions    Sulfamethoxazole-Trimethoprim Other (See Comments)     Weakness and shakiness    Morphine      "swollen itchy face"    Nickel Itching       SOCIAL HISTORY:   The patient lives lives alone and is a retired.  Social History     Tobacco Use    Smoking status: Former Smoker     Packs/day: 0.50     Years: 10.00     Pack years: 5.00     Types: Cigarettes     Last attempt to quit: 08/10/1983     Years since quitting: 35.2    Smokeless tobacco: Never Used   Substance and Sexual Activity    Alcohol use: Not Currently    Drug use: No    Sexual activity: Not Currently     Partners: Male   Social History Narrative    Born in [place]. Widowed. Three stepchildren. Close to one of the stepchildren. Lives alone with cat. Retired Actor. Enjoys golf and being with friends.    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   Review of Systems   Musculoskeletal: Positive for joint pain.   All other systems reviewed and are negative.       Objective:     PHYSICAL EXAMINATION:  Weight: 71.7 kg (158 lb) Height: 160 cm (5\' 3" ) Body mass index is 27.99 kg/m.     Physical Exam  Constitutional:       Appearance: She is well-developed.   HENT:      Head: Normocephalic.   Eyes:      Conjunctiva/sclera: Conjunctivae normal.   Pulmonary:      Effort: Pulmonary effort is normal.   Skin:     General: Skin is warm and dry.   Neurological:      Mental Status: She is alert and oriented to person, place, and time.      Deep Tendon Reflexes: Reflexes are normal and symmetric.   Psychiatric:         Behavior: Behavior normal.         Physical Exam  On physical examination, the patient is well developed and well nourished in no acute distress,pleasant and cooperative throughout the exam. Alert and oriented to person, place, and time with a normal affect. Respiratory effort is normal,with no dyspnea on exertion.    Walks with a Antalgic gait.    Hip range of motion    Flexion Extension IR ER Abduction Adduction   right 115 0 15 w/ groin discomfort 35 35 15    left 115 0 20 40 40 20     Pain on SLR: None  Abductor Strength: 5/5     Intact sensation to light touchthroughout the lower extremities and 5/5  strength in bilateral lower extremity muscle groups. Has no skin lesions or rashes and extremities show no signs of peripheral edema and are warm and well perfused with brisk capillary refill and palpable distal pulses. Leg length discrepancy:none    IMAGING:  I personally reviewed and interpretedthe radiographs obtained on 08/10/2018.    X-RAYS: AP pelvis and AP and lateral views of the right hip demonstrate advanced joint  space narrowing.    AP and lateral views of the right knee demonstrate relatively well-preserved joint spaces.    REVIEW OF OUTSIDE RECORDS: None.    Assessment & Plan:     IMPRESSION:   Aimee Smith who is a 73 y.o. female who has right hip OA with referred right knee pain. We discussed both the natural history and the treatment options with the patient at length today, including both conservative andsurgical measures.      Plan:  Given history of steroid use, recommend MRI to evaluate for AVN.  She prefers open MRI, will f/u in clinic to review.  Will consider CJI or f/u with surgeon.  If right knee pain persists after hip injection, will repeat right knee x-ray to include WB views, or will also consider MRI to r/o AVN.      Nikan Ellingson L Madysin Crisp, PA  This is an independent service.  The available consultant for this service is Mora Appl, MD.

## 2018-10-18 NOTE — Patient Instructions (Signed)
Go down to second floor to schedule the right hip MRI.  Once you complete study, call [phone] to schedule a f/u clinic to review the MRI results and next step. Bring CD with images for download.

## 2018-10-20 DIAGNOSIS — M25551 Pain in right hip: Secondary | ICD-10-CM

## 2018-10-21 ENCOUNTER — Ambulatory Visit: Admit: 2018-10-21 | Discharge: 2018-10-21 | Payer: MEDICARE

## 2018-10-25 NOTE — Telephone Encounter (Signed)
Reviewed MRI results with patient. 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 Discussed that she will likely need a right THA and at some point in the future a left THA when she begins to note pain.    Currently she is in th process of moving to [place] by then end of the month and will try to establish care there or continue here. Discussed she could try a CJI if she wishes but she prefers to call back when she has figured out her moving schedule.

## 2019-07-26 MED ORDER — METOPROLOL TARTRATE 50 MG TABLET
50 | ORAL_TABLET | ORAL | 1 refills | Status: AC
Start: 2019-07-26 — End: ?

## 2019-07-26 NOTE — Telephone Encounter (Signed)
Patient calling for the following reason:    Has moved to [place] and will be going back and forth to [place].   Refill of Metoprolol 50 mg   Pt is out of medications  Best number to reach caller at: same  Patient's home phone number: [phone]       OTT Reason for Call: Rx refill call from patient         Current pended order:  metoprolol tartrate (LOPRESSOR) 50 mg tablet      Last time approved:  metoprolol tartrate (LOPRESSOR) 50 mg tablet  1 TABLET IN THE MORNING, 1/2 TABLET AT NIGHT  Dispense: 135 tablet Refill: 3  [facility] 4696 1 by JIH, JANE on 07/21/18        Last appointment:2 /18/20  Next appointment:  Visit date not found        Last  BP:  BP Readings from Last 1 Encounters:   09/26/18 146/49         Last Labs:      Lab Results   Component Value Date    Creatinine 0.66 09/18/2018    Sodium, Serum / Plasma 136 09/18/2018    Potassium, Serum / Plasma 4.1 09/18/2018    Hemoglobin A1c 5.7 (H) 03/13/2018    Cholesterol, Total 196 07/11/2015    LDL Cholesterol 132 (H) 07/11/2015    Cholesterol, HDL 48 07/11/2015    Thyroid Stimulating Hormone 2.84 05/23/2018    Hemoglobin 13.2 09/18/2018    eGFR - low estimate 88 09/18/2018    eGFR - high estimate 102 09/18/2018       If patient has not been seen in clinic for 12 months or more I have routed encounter to the administrative team to book a follow-up appointment.

## 2019-08-15 MED ORDER — GABAPENTIN 300 MG CAPSULE
300 | ORAL_CAPSULE | Freq: Every day | ORAL | 1 refills | Status: AC
Start: 2019-08-15 — End: ?

## 2019-08-15 NOTE — Telephone Encounter (Signed)
Pt calling for the following reason:    Requesting refill for Gabapentin    States she only has 6 tablets left   Order pended. Please assist and approve or decline as appropriate.  Best number to reach caller at: same  Patient's home phone number: [phone]       OTT Reason for Call: Rx refill call from patient

## 2019-08-28 MED ORDER — LEVETIRACETAM 250 MG TABLET
250 | ORAL | 2 refills | Status: AC
Start: 2019-08-28 — End: ?

## 2019-10-03 NOTE — Telephone Encounter (Signed)
Requested admin reach out and book f/u or ascertain if pt has transferred care to [place].

## 2019-10-04 NOTE — Telephone Encounter (Signed)
I contacted patient and left a message to see If patient is still receiving care at [facility]. When patient calls back: please help schedule if still wants to be seen by pcp if not remove from pcps list.

## 2021-02-18 IMAGING — MR MRI HIP RT W/O CONTRAST
11 series · 40 of 40 positions shown · non-contrast
Comparison: none

﻿

MAGNETIC RESONANCE IMAGING RIGHT HIP WITHOUT CONTRAST ADMINISTRATION
HISTORY: Bilateral hip pain, right greater than left.  Osteonecrosis.  Claustrophobia.
TECHNIQUE: Multiplanar magnetic resonance imaging of the right hip was accomplished employing a surface coil and an open 0.3[employer] scanner without intravenous contrast administration.  T1 and T2-weighted thin slice sections were obtained.

[Series 1: trs/cor scano · axial · right · 7.0mm · 1.56mm/px · z∈[-28,+200]mm · 4 of 16 slices shown (1 of 2)]
[im 1/16]
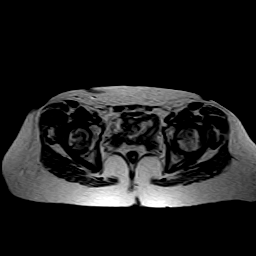
[im 6/16]
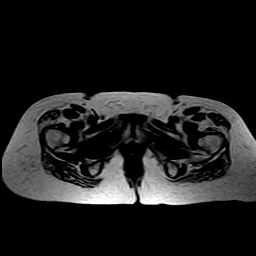
[im 11/16]
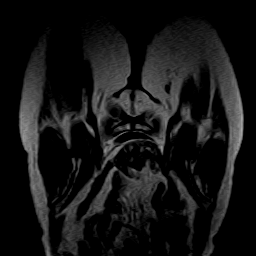
[im 16/16]
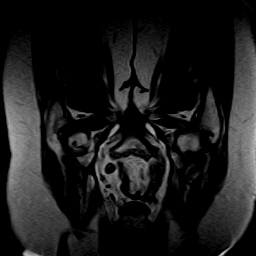

[Series 2: trs/cor scano · axial · right · 7.0mm · 1.56mm/px · z∈[-28,+200]mm · 4 of 16 slices shown (2 of 2)]
[im 1/16]
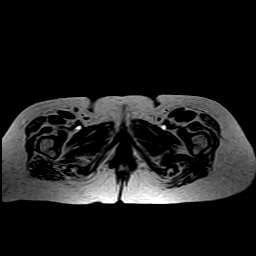
[im 6/16]
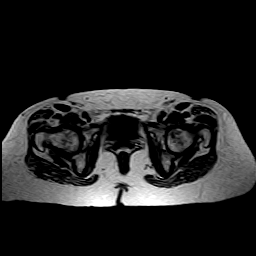
[im 11/16]
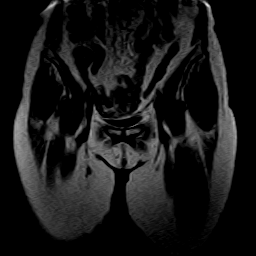
[im 16/16]
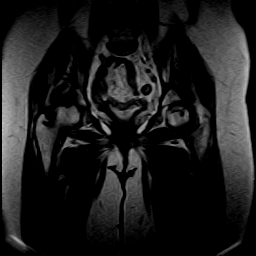

[Series 3: T1 · coronal · right · 6.0mm · 1.25mm/px · 4 of 18 slices shown (1 of 3)]
[im 1/18]
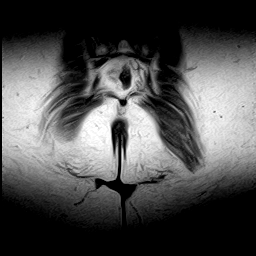
[im 6/18]
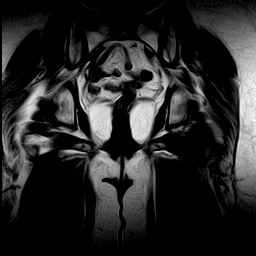
[im 12/18]
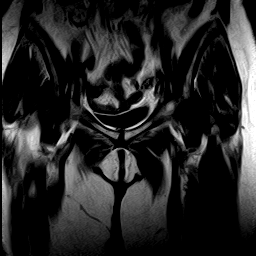
[im 18/18]
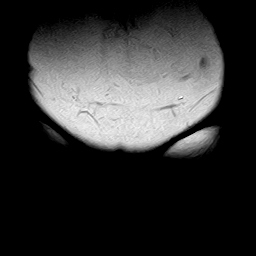

[Series 4: STIR · coronal · right · 6.0mm · 1.25mm/px · 4 of 18 slices shown (1 of 3)]
[im 1/18]
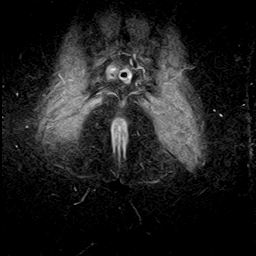
[im 6/18]
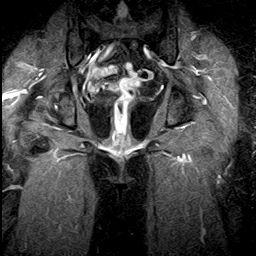
[im 12/18]
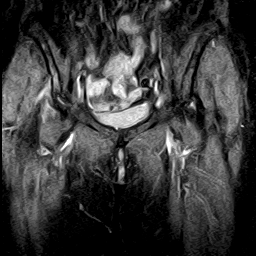
[im 18/18]
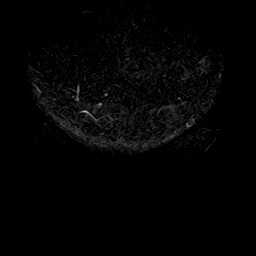

[Series 5: T2 · axial · right · 6.0mm · 1.17mm/px · z∈[-59,+93]mm · 4 of 20 slices shown (1 of 3)]
[im 1/20]
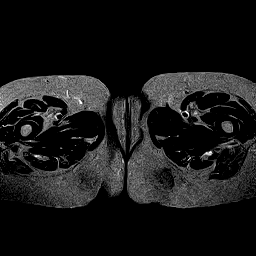
[im 7/20]
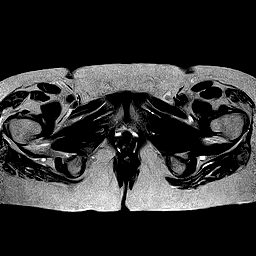
[im 13/20]
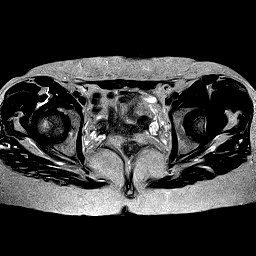
[im 20/20]
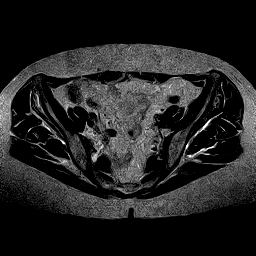

[Series 6: STIR · sagittal · right · 5.0mm · 0.94mm/px · 4 of 20 slices shown (2 of 3)]
[im 1/20]
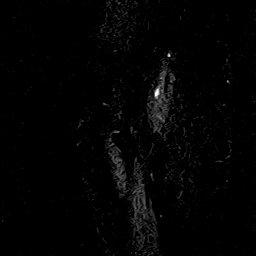
[im 7/20]
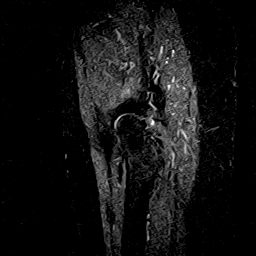
[im 13/20]
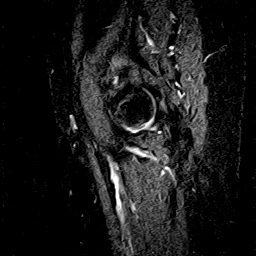
[im 20/20]
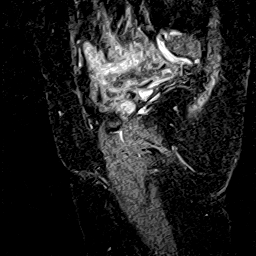

[Series 7: T1 · axial · right · 5.0mm · 0.94mm/px · z∈[-30,+60]mm · 3 of 16 slices shown (2 of 3)]
[im 1/16]
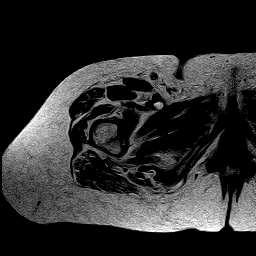
[im 8/16]
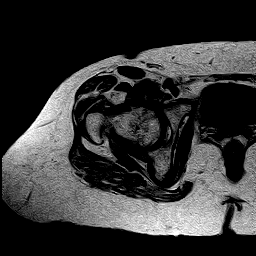
[im 16/16]
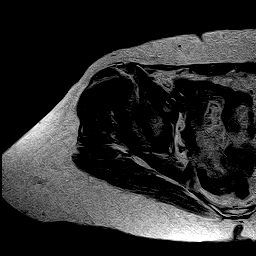

[Series 8: T2 · axial · right · 5.0mm · 0.94mm/px · z∈[-97,-22]mm · 3 of 16 slices shown (2 of 3)]
[im 1/16]
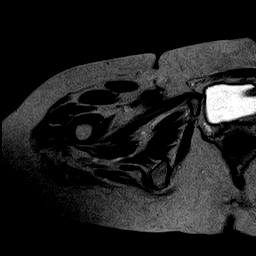
[im 8/16]
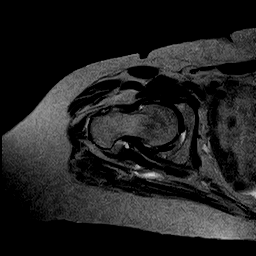
[im 16/16]
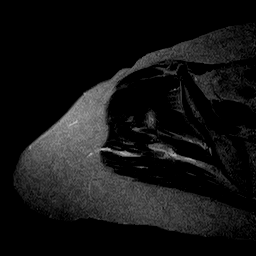

[Series 9: STIR · sagittal · right · 5.0mm · 0.94mm/px · 4 of 20 slices shown (3 of 3)]
[im 1/20]
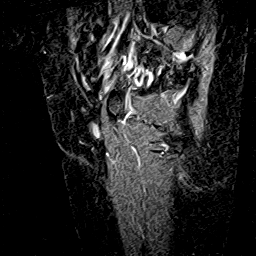
[im 7/20]
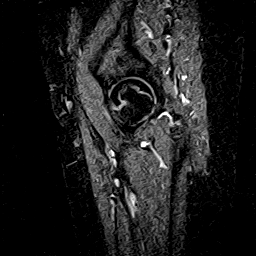
[im 13/20]
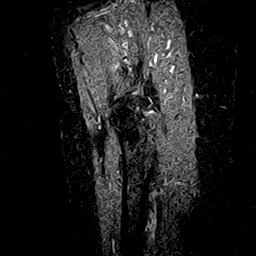
[im 20/20]
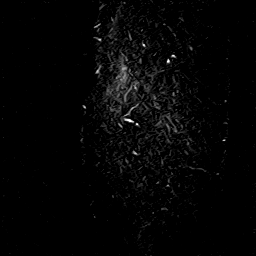

[Series 10: T1 · axial · right · 5.0mm · 0.94mm/px · z∈[-29,+61]mm · 3 of 16 slices shown (3 of 3)]
[im 1/16]
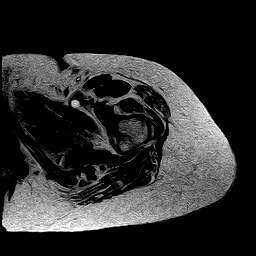
[im 8/16]
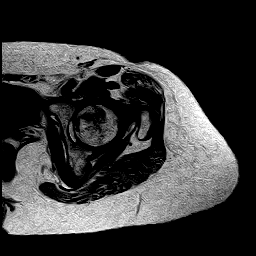
[im 16/16]
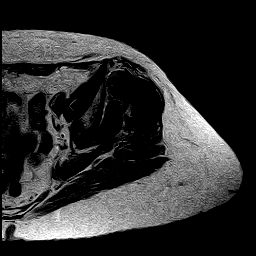

[Series 11: T2 · axial · right · 5.0mm · 0.94mm/px · z∈[+24,+97]mm · 3 of 16 slices shown (3 of 3)]
[im 1/16]
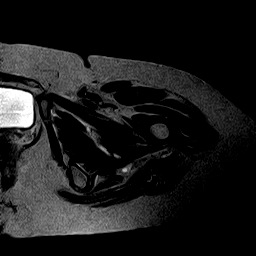
[im 8/16]
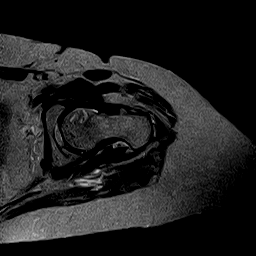
[im 16/16]
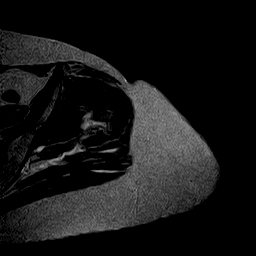

[40 of 40 positions shown; findings below may reference images not displayed]

FINDINGS: Patient has history of prior examination which has been requested though not obtained.  If old study can be acquired, a comparison will be made to today’s examination, and an addendum issued to current report.

There is a large/broad region of remote avascular necrosis involving the right femoral head.  There is localized articular surface irregularity/minimal localized femoral head collapse superiorly.  Do not see evidence for radiating marrow edema.  Severe osteoarthritic change of the right hip joint is present with disproportionate superior joint space height loss.  There is significant subchondral sclerosis and cystic change involving the osseous acetabulum.  A small right hip joint effusion is present.  Lobulated fluid collection extending superiorly and laterally from the acetabular margin suggesting paralabral cyst and underlying degenerative labral tear.  Minor gluteal tendinopathy without evidence for focal tear nor greater trochanteric bursitis.  There is no evidence for iliopsoas bursitis.  Visualized sacroiliac joints intact and unremarkable.  No focal abnormalities of pelvic contents identified on large field of view imaging.
IMPRESSION: 1. Remote avascular necrosis of right femoral head with minor collapse/irregularity as described. 

2. Severe right hip joint osteoarthritic change with joint effusion and degenerative-appearing paralabral cyst. 

3. Additional secondary findings as discussed in-depth in body of report.

## 2022-03-18 IMAGING — MR MRI CERVICAL SPINE WITHOUT CONTRAST
7 of 8 series · 36 of 48 positions shown · non-contrast
Comparison: none

﻿

MAGNETIC RESONANCE IMAGING OF THE CERVICAL SPINE WITHOUT INTRAVENOUS CONTRAST ADMINISTRATION
HISTORY: Cervicalgia.  Claustrophobia.
TECHNIQUE: Multiplanar magnetic resonance imaging of the cervical spine was accomplished utilizing a surface coil and an open 0.3[employer] scanner without intravenous contrast administration.  T1 and T2-weighted thin slice sections were obtained.

[Series 1: scano s/c · axial · 5.0mm · 1.02mm/px · z∈[-8,+130]mm · 4 of 14 slices shown]
[im 1/14]
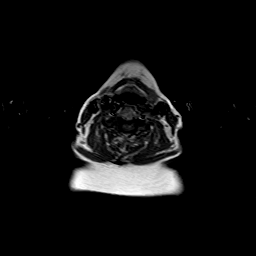
[im 5/14]
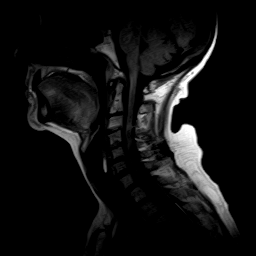
[im 9/14]
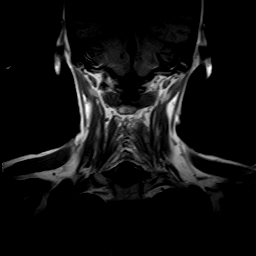
[im 14/14]
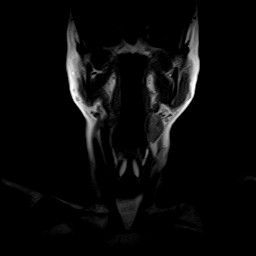

[Series 2: sag fir · sagittal · 3.0mm · 0.94mm/px · 5 of 15 slices shown]
[im 1/15]
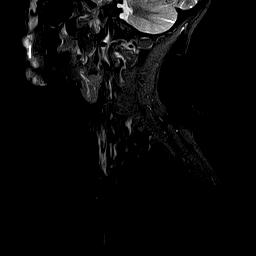
[im 4/15]
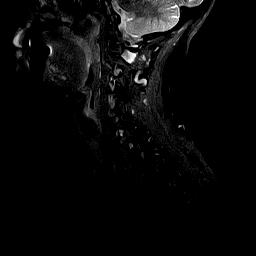
[im 8/15]
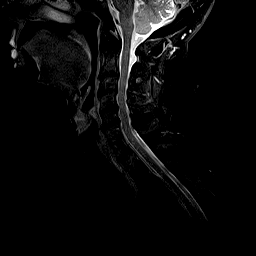
[im 11/15]
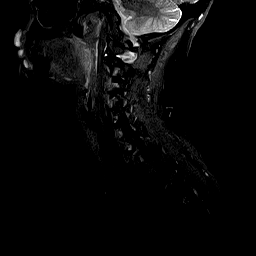
[im 15/15]
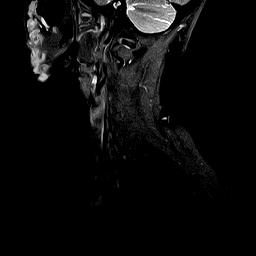

[Series 3: T1 · sagittal · 3.0mm · 0.94mm/px · 5 of 15 slices shown]
[im 1/15]
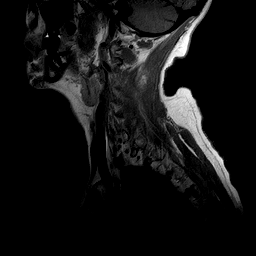
[im 4/15]
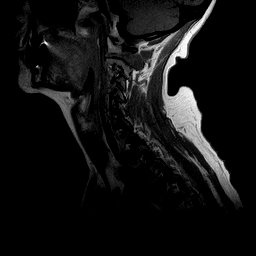
[im 8/15]
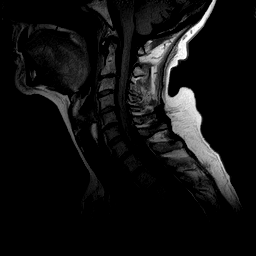
[im 11/15]
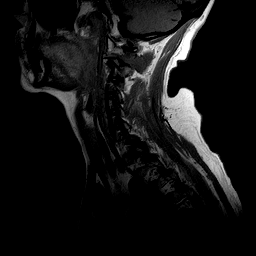
[im 15/15]
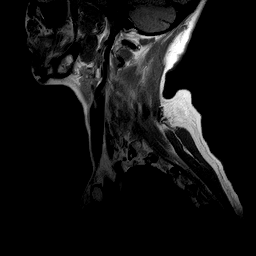

[Series 4: T2 · sagittal · 3.0mm · 0.94mm/px · 5 of 15 slices shown]
[im 1/15]
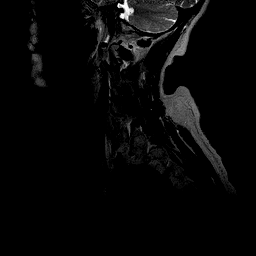
[im 4/15]
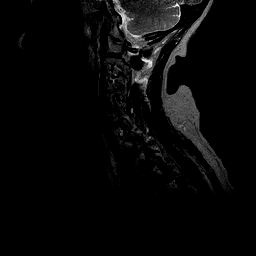
[im 8/15]
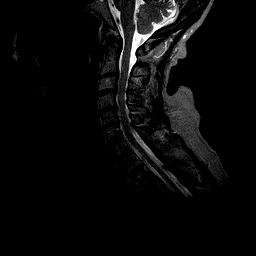
[im 11/15]
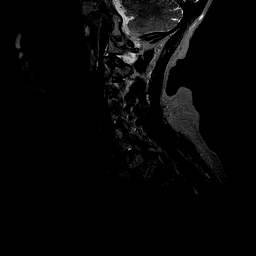
[im 15/15]
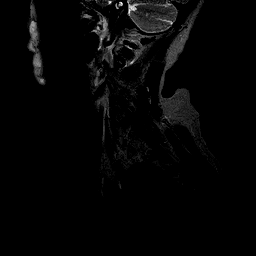

[Series 5: trs ge w/mtc · axial · 3.0mm · 1.02mm/px · z∈[-105,-8]mm · 8 of 26 slices shown]
[im 1/26]
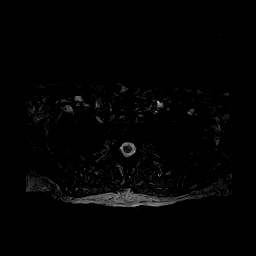
[im 4/26]
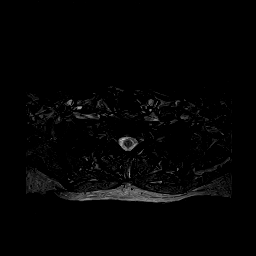
[im 8/26]
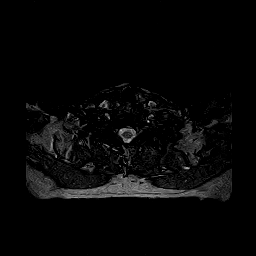
[im 11/26]
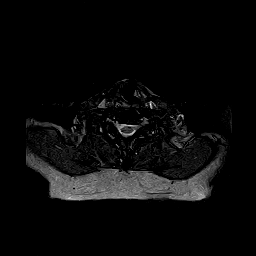
[im 15/26]
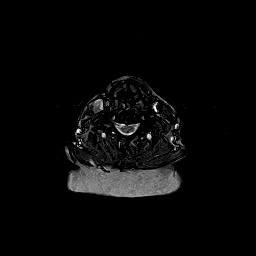
[im 18/26]
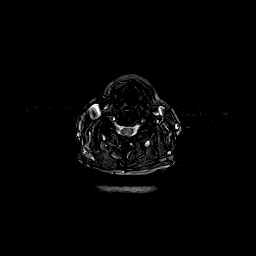
[im 22/26]
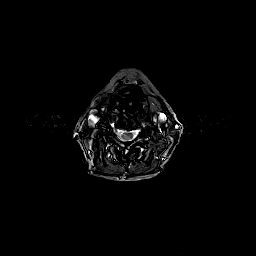
[im 26/26]
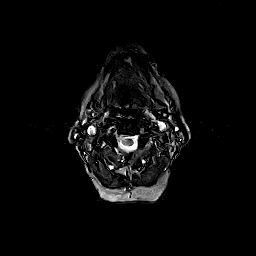

[Series 6: (id) · axial · 3.0mm · 1.02mm/px · z∈[-105,-8]mm · 8 of 26 slices shown]
[im 1/26]
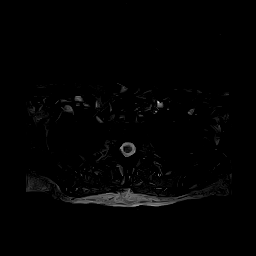
[im 4/26]
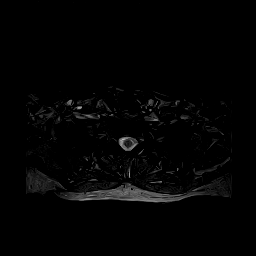
[im 8/26]
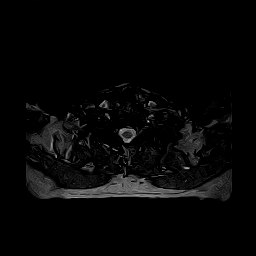
[im 11/26]
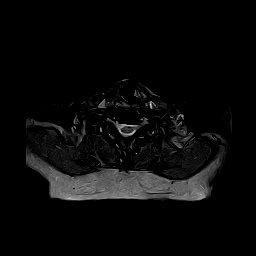
[im 15/26]
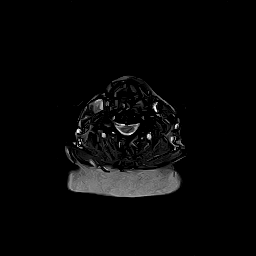
[im 18/26]
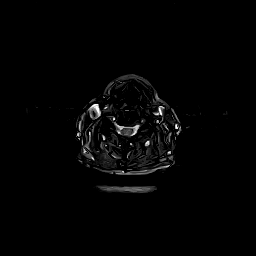
[im 22/26]
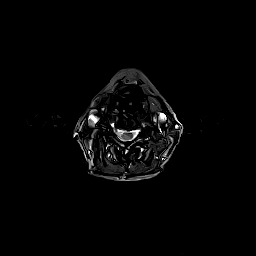
[im 26/26]
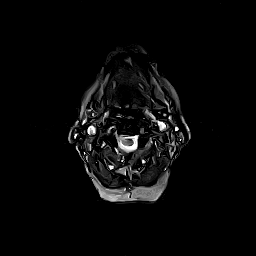

[Series 7: cor shim · coronal · 10.0mm · 4.69mm/px · 1 of 3 slices shown]
[im 1/3]
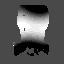

[36 of 48 positions shown; findings below may reference images not displayed]

FINDINGS: No prior studies are available for comparison purposes. 

A mild cervical dextroscoliosis is present.  Anterior to posterior alignment maintained.  The cervical vertebrae appear normal in height.  There are no significant abnormalities of regional bone marrow signal. 

There is excessive patient motion.  No clear focal abnormalities of included paraspinal soft tissues. 

Moderate generalized cervical degenerative disc disease is present with disc desiccation and disc space narrowing.  Moderate spondylotic change noted as well with extradural spurring and facet joint sclerosis.  Patient demonstrates mild congenital shortness of cervical vertebral pedicles. 

No significant extradural abnormalities at C1-C2 interspace. 

Examination of the C2-C3 interspace demonstrates spondylosis with small ventral disc and spur complex.  No cord compression or foraminal compromise.  

Examination of the C3-C4 interspace demonstrates spondylosis with moderate sized ventral disc and spur complex.  Disc and spur appear to contact and slightly flatten ventral cord with mild central and lateral cervical spinal canal stenosis present.  AP diameter of spinal canal estimated at 7-8 mm.  Mild to moderate narrowing of left C4 neural foramen.  

Examination of the C4-C5 interspace demonstrates spondylosis with moderate sized ventral disc and spur complex and broad-based degenerative disc bulging.  Disc and spur contact and slightly flatten ventral cord with mild central and lateral cervical spinal canal stenosis present.  AP diameter of spinal canal estimated at 8 mm focally.  Moderate right and mild to moderate left bilateral C5 foraminal stenoses suggested. 

Examination of the C5-C6 interspace demonstrates spondylosis with small to moderate sized ventral disc and spur complex.  This appears to contact though not significantly compress the ventral cord.  AP diameter of thecal sac estimated at 9 mm.  Lateral extradural spurring with moderate bilateral C6 neural foraminal stenoses.  

No significant extradural abnormalities noted at the C6-C7 or C7-T1 interspaces.  Only minimal degenerative change present at these levels.  

When allowance is made for excessive patient motion, there is no clear evidence for cord enlargement nor localized myelomalacia.
IMPRESSION: 1. Moderate generalized cervical spondylosis and degenerative disc disease.  Patient demonstrates mild congenital shortness of cervical vertebral pedicles.

2. Multilevel central and lateral mild cervical spinal canal stenoses as discussed.  There are bilateral cervical neural foraminal stenoses.  

3. Minor dextroscoliosis. 

4. Please see in-depth discussion in body of report.
